# Patient Record
Sex: Female | Born: 1970 | Race: White | Hispanic: No | Marital: Married | Smoking: Never smoker
Health system: Southern US, Community
[De-identification: ages and names within clinical notes are randomized; demographics above are authoritative.]

## PROBLEM LIST (undated history)

## (undated) DIAGNOSIS — I341 Nonrheumatic mitral (valve) prolapse: Secondary | ICD-10-CM

## (undated) DIAGNOSIS — I4581 Long QT syndrome: Secondary | ICD-10-CM

## (undated) DIAGNOSIS — R1032 Left lower quadrant pain: Secondary | ICD-10-CM

## (undated) DIAGNOSIS — N83299 Other ovarian cyst, unspecified side: Secondary | ICD-10-CM

## (undated) DIAGNOSIS — I509 Heart failure, unspecified: Secondary | ICD-10-CM

## (undated) DIAGNOSIS — N3281 Overactive bladder: Secondary | ICD-10-CM

## (undated) DIAGNOSIS — R638 Other symptoms and signs concerning food and fluid intake: Secondary | ICD-10-CM

## (undated) DIAGNOSIS — D249 Benign neoplasm of unspecified breast: Secondary | ICD-10-CM

## (undated) DIAGNOSIS — Z8674 Personal history of sudden cardiac arrest: Secondary | ICD-10-CM

## (undated) HISTORY — DX: Nonrheumatic mitral (valve) prolapse: I34.1

## (undated) HISTORY — DX: Personal history of sudden cardiac arrest: Z86.74

## (undated) HISTORY — DX: Long QT syndrome: I45.81

## (undated) HISTORY — PX: BREAST BIOPSY: SHX20

## (undated) HISTORY — DX: Heart failure, unspecified: I50.9

## (undated) HISTORY — PX: BREAST EXCISIONAL BIOPSY: SUR124

## (undated) HISTORY — DX: Left lower quadrant pain: R10.32

## (undated) HISTORY — DX: Overactive bladder: N32.81

## (undated) HISTORY — DX: Other symptoms and signs concerning food and fluid intake: R63.8

## (undated) HISTORY — DX: Benign neoplasm of unspecified breast: D24.9

## (undated) HISTORY — DX: Other ovarian cyst, unspecified side: N83.299

## (undated) HISTORY — PX: MITRAL VALVE REPAIR: SHX2039

## (undated) MED FILL — Rosuvastatin Calcium Tab 5 MG: ORAL | Fill #0 | Status: CN

---

## 1999-02-18 HISTORY — PX: BREAST SURGERY: SHX581

## 2003-10-16 ENCOUNTER — Other Ambulatory Visit: Payer: Self-pay

## 2004-10-25 ENCOUNTER — Ambulatory Visit: Payer: Self-pay | Admitting: Internal Medicine

## 2005-01-16 ENCOUNTER — Emergency Department: Payer: Self-pay | Admitting: Emergency Medicine

## 2005-11-18 ENCOUNTER — Observation Stay: Payer: Self-pay

## 2005-11-24 ENCOUNTER — Inpatient Hospital Stay: Payer: Self-pay | Admitting: Obstetrics and Gynecology

## 2007-02-18 DIAGNOSIS — Z8674 Personal history of sudden cardiac arrest: Secondary | ICD-10-CM

## 2007-02-18 DIAGNOSIS — I4581 Long QT syndrome: Secondary | ICD-10-CM

## 2007-02-18 HISTORY — PX: CARDIAC DEFIBRILLATOR PLACEMENT: SHX171

## 2007-02-18 HISTORY — DX: Personal history of sudden cardiac arrest: Z86.74

## 2007-02-18 HISTORY — DX: Long QT syndrome: I45.81

## 2007-03-05 ENCOUNTER — Ambulatory Visit: Payer: Self-pay | Admitting: Obstetrics and Gynecology

## 2007-04-01 ENCOUNTER — Encounter: Payer: Self-pay | Admitting: Maternal & Fetal Medicine

## 2008-02-24 ENCOUNTER — Ambulatory Visit: Payer: Self-pay | Admitting: Obstetrics and Gynecology

## 2009-03-12 ENCOUNTER — Other Ambulatory Visit: Payer: Self-pay | Admitting: Family

## 2010-05-23 ENCOUNTER — Ambulatory Visit: Payer: Self-pay | Admitting: Internal Medicine

## 2011-01-07 ENCOUNTER — Encounter: Payer: Self-pay | Admitting: Internal Medicine

## 2011-02-07 ENCOUNTER — Ambulatory Visit (INDEPENDENT_AMBULATORY_CARE_PROVIDER_SITE_OTHER): Payer: BC Managed Care – PPO | Admitting: Internal Medicine

## 2011-02-07 ENCOUNTER — Encounter: Payer: Self-pay | Admitting: Internal Medicine

## 2011-02-07 VITALS — BP 102/68 | HR 64 | Temp 97.5°F | Ht 62.75 in | Wt 145.0 lb

## 2011-02-07 DIAGNOSIS — I341 Nonrheumatic mitral (valve) prolapse: Secondary | ICD-10-CM | POA: Insufficient documentation

## 2011-02-07 DIAGNOSIS — J329 Chronic sinusitis, unspecified: Secondary | ICD-10-CM

## 2011-02-07 DIAGNOSIS — I4581 Long QT syndrome: Secondary | ICD-10-CM | POA: Insufficient documentation

## 2011-02-07 MED ORDER — MOMETASONE FUROATE 50 MCG/ACT NA SUSP: 2.0000 | Freq: Every day | NASAL | Status: DC

## 2011-02-07 MED ORDER — AMOXICILLIN-POT CLAVULANATE 875-125 MG PO TABS: 1.0000 | ORAL_TABLET | Freq: Two times a day (BID) | ORAL | Status: AC

## 2011-02-07 MED ORDER — FLUCONAZOLE 150 MG PO TABS: 150.0000 mg | ORAL_TABLET | Freq: Every day | ORAL | Status: DC

## 2011-02-07 NOTE — Progress Notes (Signed)
  Subjective:    Patient ID: Alyssa Melendez, female    DOB: 27-Nov-1970, 40 y.o.   MRN: [id number]  HPI  Alyssa Melendez is a 40 yo [employer] who has a history of mitral valve prolapse, long QT syndrome, who presents for her annual exam. Her PAP smear is done regularly by her gynecologist.     Review of Systems     Objective:   Physical Exam        Assessment & Plan:   Subjective:     Alyssa Melendez is a 40 y.o. female and is here for a comprehensive physical exam. The patient reports problems - sinusitis.  History   Social History  . Marital Status: Single    Spouse Name: N/A    Number of Children: N/A  . Years of Education: N/A   Occupational History  . Not on file.   Social History Main Topics  . Smoking status: Never Smoker   . Smokeless tobacco: Not on file  . Alcohol Use: Yes  . Drug Use: No  . Sexually Active:    Other Topics Concern  . Not on file   Social History Narrative  . No narrative on file   No health maintenance topics applied.  The following portions of the patient's history were reviewed and updated as appropriate: allergies, current medications, past family history, past medical history, past social history, past surgical history and problem list.  Review of Systems A comprehensive review of systems was negative except for: Ears, nose, mouth, throat, and face: positive for nasal congestion, snoring and sore throat   Objective:    BP 102/68  Pulse 64  Temp(Src) 97.5 F (36.4 C) (Oral)  Ht 5' 2.75" (1.594 m)  Wt 145 lb (65.772 kg)  BMI 25.89 kg/m2  LMP 02/07/2011 General appearance: alert, cooperative and appears stated age Head: Normocephalic, without obvious abnormality, atraumatic Eyes: conjunctivae/corneas clear. PERRL, EOM's intact. Fundi benign. Ears: normal TM's and external ear canals both ears Nose: purulent discharge, moderate congestion, sinus tenderness bilateral Throat: lips, mucosa, and tongue normal; teeth and gums  normal Neck: mild anterior cervical adenopathy, no carotid bruit, no JVD, supple, symmetrical, trachea midline and thyroid not enlarged, symmetric, no tenderness/mass/nodules Lungs: clear to auscultation bilaterally Heart: regular rate and rhythm, S1, S2 normal, no murmur, click, rub or gallop Abdomen: soft, non-tender; bowel sounds normal; no masses,  no organomegaly Extremities: extremities normal, atraumatic, no cyanosis or edema Skin: Skin color, texture, turgor normal. No rashes or lesions Lymph nodes: Axillary adenopathy: normal, Supraclavicular adenopathy: normal and Inguinal adenopathy: normal Neurologic: Alert and oriented X 3, normal strength and tone. Normal symmetric reflexes. Normal coordination and gait   Assessment:    Healthy female exam.  Breast and pelvic exam done annually by her gynecologist.  Sinusitis: Mitral valve prolapse,  Long QT syndrome  Scheduled for repair Jan 7    Plan:  Reviewed screening  labs by hospital, including lipids and fasting glucose.  No issues. Augmentin and saline nasal spry for sinusitis.  Topical Afrin prn, avoid decongestants given cardiac history. Repair of MVP per patient scheduled at [facility] for Jan 7   See After Visit Summary for Counseling Recommendations

## 2011-02-12 ENCOUNTER — Encounter: Payer: Self-pay | Admitting: Internal Medicine

## 2011-02-12 DIAGNOSIS — Z8674 Personal history of sudden cardiac arrest: Secondary | ICD-10-CM | POA: Insufficient documentation

## 2011-03-03 ENCOUNTER — Telehealth: Payer: Self-pay | Admitting: *Deleted

## 2011-03-03 DIAGNOSIS — D649 Anemia, unspecified: Secondary | ICD-10-CM

## 2011-03-03 NOTE — Telephone Encounter (Signed)
Patient informed order for CBC. She had surgery recently and now c/o slight dizziness upon standing. She would like Hgb & Hct checked, states they were low at d/c after surgery.

## 2011-03-03 NOTE — Telephone Encounter (Signed)
Cbc order placed in [facility]

## 2011-03-03 NOTE — Telephone Encounter (Signed)
Patient informed, Scheduled for labs tomorrow

## 2011-03-04 ENCOUNTER — Other Ambulatory Visit (INDEPENDENT_AMBULATORY_CARE_PROVIDER_SITE_OTHER): Payer: BC Managed Care – PPO | Admitting: *Deleted

## 2011-03-04 DIAGNOSIS — D649 Anemia, unspecified: Secondary | ICD-10-CM

## 2011-03-04 LAB — CBC WITH DIFFERENTIAL/PLATELET
Basophils Absolute: 0 10*3/uL (ref 0.0–0.1)
Eosinophils Absolute: 0.8 10*3/uL — ABNORMAL HIGH (ref 0.0–0.7)
Hemoglobin: 11 g/dL — ABNORMAL LOW (ref 12.0–15.0)
Lymphocytes Relative: 11.6 % — ABNORMAL LOW (ref 12.0–46.0)
Lymphs Abs: 1.2 10*3/uL (ref 0.7–4.0)
Monocytes Relative: 10.2 % (ref 3.0–12.0)
Neutro Abs: 7.4 10*3/uL (ref 1.4–7.7)
Neutrophils Relative %: 70.3 % (ref 43.0–77.0)
RBC: 3.48 Mil/uL — ABNORMAL LOW (ref 3.87–5.11)
RDW: 14.1 % (ref 11.5–14.6)
WBC: 10.5 10*3/uL (ref 4.5–10.5)

## 2011-04-04 ENCOUNTER — Other Ambulatory Visit: Payer: Self-pay | Admitting: Internal Medicine

## 2011-04-04 MED ORDER — AMOXICILLIN-POT CLAVULANATE 875-125 MG PO TABS: 1.0000 | ORAL_TABLET | Freq: Two times a day (BID) | ORAL | Status: AC

## 2011-04-08 ENCOUNTER — Other Ambulatory Visit: Payer: Self-pay | Admitting: *Deleted

## 2011-04-09 MED ORDER — FLUCONAZOLE 150 MG PO TABS: 150.0000 mg | ORAL_TABLET | Freq: Every day | ORAL | Status: AC

## 2011-04-10 ENCOUNTER — Telehealth: Payer: Self-pay | Admitting: Internal Medicine

## 2011-04-10 NOTE — Telephone Encounter (Signed)
Got a message from her pharamacy about a contraindication between flucomazole rx and amiodarone.  If somenone has prescribed her amiodarone (not me)  She should not take fluconazole. Please let her know

## 2011-04-10 NOTE — Telephone Encounter (Signed)
Left message on voice mail asking pt to call me back.

## 2011-04-17 LAB — HM MAMMOGRAPHY

## 2011-04-23 ENCOUNTER — Other Ambulatory Visit: Payer: Self-pay | Admitting: Internal Medicine

## 2011-04-23 MED ORDER — FLUCONAZOLE 100 MG PO TABS: 100.0000 mg | ORAL_TABLET | Freq: Every day | ORAL | Status: DC

## 2011-07-19 HISTORY — PX: CARDIAC DEFIBRILLATOR PLACEMENT: SHX171

## 2011-09-26 ENCOUNTER — Ambulatory Visit (INDEPENDENT_AMBULATORY_CARE_PROVIDER_SITE_OTHER): Payer: BC Managed Care – PPO | Admitting: Internal Medicine

## 2011-09-26 ENCOUNTER — Encounter: Payer: Self-pay | Admitting: Internal Medicine

## 2011-09-26 VITALS — BP 100/60 | HR 106 | Temp 98.4°F | Resp 16 | Wt 148.5 lb

## 2011-09-26 DIAGNOSIS — L259 Unspecified contact dermatitis, unspecified cause: Secondary | ICD-10-CM

## 2011-09-26 DIAGNOSIS — H539 Unspecified visual disturbance: Secondary | ICD-10-CM

## 2011-09-26 DIAGNOSIS — M25512 Pain in left shoulder: Secondary | ICD-10-CM

## 2011-09-26 DIAGNOSIS — I341 Nonrheumatic mitral (valve) prolapse: Secondary | ICD-10-CM

## 2011-09-26 DIAGNOSIS — M25519 Pain in unspecified shoulder: Secondary | ICD-10-CM

## 2011-09-26 DIAGNOSIS — I059 Rheumatic mitral valve disease, unspecified: Secondary | ICD-10-CM

## 2011-09-26 MED ORDER — ALPRAZOLAM 0.25 MG PO TABS: 0.2500 mg | ORAL_TABLET | Freq: Every evening | ORAL | Status: DC | PRN

## 2011-09-26 MED ORDER — TRIAMCINOLONE ACETONIDE 0.1 % EX LOTN: TOPICAL_LOTION | Freq: Two times a day (BID) | CUTANEOUS | Status: DC

## 2011-09-28 ENCOUNTER — Encounter: Payer: Self-pay | Admitting: Internal Medicine

## 2011-09-28 DIAGNOSIS — L259 Unspecified contact dermatitis, unspecified cause: Secondary | ICD-10-CM | POA: Insufficient documentation

## 2011-09-28 DIAGNOSIS — M25512 Pain in left shoulder: Secondary | ICD-10-CM | POA: Insufficient documentation

## 2011-09-28 DIAGNOSIS — H539 Unspecified visual disturbance: Secondary | ICD-10-CM | POA: Insufficient documentation

## 2011-09-28 NOTE — Assessment & Plan Note (Signed)
Involving the preauricular area and hairline behind her left ear. refill steroid cream and use as needed.

## 2011-09-28 NOTE — Assessment & Plan Note (Addendum)
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 The onset of the changes occurred after she went to mitral valve repair which required being on bypass for several hours. This occurred in January 2013 .  She has not seen a neurologist or a neuro-ophthalmologist. I'm referring her to call sooner for evaluation of her optic nerve.

## 2011-09-28 NOTE — Assessment & Plan Note (Signed)
Her left shoulder pain may be due to do to rotator cuff syndrome. It could also be related to her ICD placement and mitral valve repair. Will refer to 10 bars for evaluation.

## 2011-09-28 NOTE — Progress Notes (Signed)
Patient ID: Alyssa Melendez, female   DOB: 1970-05-26, 41 y.o.   MRN: [id number]  Patient Active Problem List  Diagnosis  . Long Q-T syndrome  . Mitral valve prolapse  . Hx-sudden cardiac arrest  . Visual changes  . Shoulder pain, left  . Contact dermatitis and eczema    Subjective:  CC:   Chief Complaint  Patient presents with  . Shoulder Pain    left  . Rash  . Eye Problem    vision problems    HPI:   Alyssa Melendez a 41 y.o. female who presents with several issues #1)  she's been having a recurrence of the rash behind her left ear which is now up into in the hairline. She thinks is caused by her sunglasses which she wears frequently and sweats behind behind the sunglasses arm. She is requesting a refill on steroid cream that I gave her last time. #2 ) left arm and shoulder pain she believes she has damaged her rotator cuff. She is having trouble reaching backwards and doing any internal rotation. She has no history of falls she carries  Her purse on the other side. Symptoms have been present for several months. #3)  since her mitral valve repair in January 2013 he has had several discrete episodes of visual changes were everything turns of silver. She describes the a background of everything as watery and sober. She still able to see and and has already seen her optometrist thinks it is a form of an optic migraine. The episodes happen once or twice a week they occur both at rest and at work. They have not become more frequent. She's had no prior ophthalmologist neurology evaluation.   Past Medical History  Diagnosis Date  . Long Q-T syndrome 2009    with Sudden Cardiac arrest  . Mitral valve prolapse   . Hx-sudden cardiac arrest 2009    secondary to Long QT Syndrome    Past Surgical History  Procedure Date  . Cardiac defibrillator placement 2009  . Cesarean section 2007 and 2005  . Breast surgery 2001    Benign  . Cardiac defibrillator placement June 2013          The following portions of the patient's history were reviewed and updated as appropriate: Allergies, current medications, and problem list.    Review of Systems:   12 Pt  review of systems was negative except those addressed in the HPI,     History   Social History  . Marital Status: Married    Spouse Name: N/A    Number of Children: N/A  . Years of Education: N/A   Occupational History  . Not on file.   Social History Main Topics  . Smoking status: Never Smoker   . Smokeless tobacco: Never Used  . Alcohol Use: Yes  . Drug Use: No  . Sexually Active: Not on file   Other Topics Concern  . Not on file   Social History Narrative  . No narrative on file    Objective:  BP 100/60  Pulse 106  Temp 98.4 F (36.9 C) (Oral)  Resp 16  Wt 148 lb 8 oz (67.359 kg)  SpO2 97%  LMP 09/14/2011  General appearance: alert, cooperative and appears stated age Ears: normal TM's and external ear canals both ears Throat: lips, mucosa, and tongue normal; teeth and gums normal Neck: no adenopathy, no carotid bruit, supple, symmetrical, trachea midline and thyroid not enlarged, symmetric, no tenderness/mass/nodules Back: symmetric, no  curvature. ROM normal. No CVA tenderness. Lungs: clear to auscultation bilaterally Heart: regular rate and rhythm, S1, S2 normal, no murmur, click, rub or gallop Abdomen: soft, non-tender; bowel sounds normal; no masses,  no organomegaly Pulses: 2+ and symmetric Skin: Skin color, texture, turgor normal. No rashes or lesions Lymph nodes: Cervical, supraclavicular, and axillary nodes normal.  Assessment and Plan:  Mitral valve prolapse Status post mitral valve repair in January 2013 at [facility].  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 The onset of the changes occurred after she went to mitral valve repair  which required being on bypass for several hours. This occurred in January 2013 .  She has not seen a neurologist or a neuro-ophthalmologist. I'm referring her to call sooner for evaluation of her optic nerve.  Shoulder pain, left Her left shoulder pain may be due to do to rotator cuff syndrome. It could also be related to her ICD placement and mitral valve repair. Will refer to 10 bars for evaluation.  Contact dermatitis and eczema Involving the preauricular area and hairline behind her left ear. refill steroid cream and use as needed.   Updated Medication List Outpatient Encounter Prescriptions as of 09/26/2011  Medication Sig Dispense Refill  . ALPRAZolam (XANAX) 0.25 MG tablet Take 1 tablet (0.25 mg total) by mouth at bedtime as needed.  30 tablet  1  . aspirin 81 MG tablet Take 81 mg by mouth daily.      Marland Kitchen atenolol (TENORMIN) 50 MG tablet Take 25 mg by mouth every morning.       . mometasone (NASONEX) 50 MCG/ACT nasal spray Place 2 sprays into the nose daily.  17 g  12  . Multiple Vitamin (MULTIVITAMIN) tablet Take 1 tablet by mouth daily.        Marland Kitchen triamcinolone lotion (KENALOG) 0.1 % Apply topically 2 (two) times daily.  60 mL  1  . DISCONTD: ALPRAZolam (XANAX) 0.25 MG tablet Take 0.25 mg by mouth at bedtime as needed.        Marland Kitchen DISCONTD: triamcinolone lotion (KENALOG) 0.1 %       . DISCONTD: atenolol (TENORMIN) 50 MG tablet 50 mg. One and half tab in the morning         Orders Placed This Encounter  Procedures  . Ambulatory referral to Ophthalmology  . Ambulatory referral to Sports Medicine    No Follow-up on file.

## 2011-09-28 NOTE — Assessment & Plan Note (Signed)
Status post mitral valve repair in January 2013 at [facility].

## 2012-02-13 ENCOUNTER — Encounter: Payer: BC Managed Care – PPO | Admitting: Internal Medicine

## 2012-02-16 ENCOUNTER — Encounter: Payer: Self-pay | Admitting: Internal Medicine

## 2012-02-16 ENCOUNTER — Ambulatory Visit (INDEPENDENT_AMBULATORY_CARE_PROVIDER_SITE_OTHER): Payer: BC Managed Care – PPO | Admitting: Internal Medicine

## 2012-02-16 VITALS — BP 100/70 | HR 68 | Temp 97.8°F | Resp 12 | Ht 62.75 in | Wt 154.0 lb

## 2012-02-16 DIAGNOSIS — R5383 Other fatigue: Secondary | ICD-10-CM

## 2012-02-16 DIAGNOSIS — R5381 Other malaise: Secondary | ICD-10-CM

## 2012-02-16 DIAGNOSIS — I4581 Long QT syndrome: Secondary | ICD-10-CM

## 2012-02-16 DIAGNOSIS — L680 Hirsutism: Secondary | ICD-10-CM

## 2012-02-16 DIAGNOSIS — J329 Chronic sinusitis, unspecified: Secondary | ICD-10-CM

## 2012-02-16 MED ORDER — FLUCONAZOLE 150 MG PO TABS: 150.0000 mg | ORAL_TABLET | Freq: Every day | ORAL | Status: DC

## 2012-02-16 MED ORDER — MOMETASONE FUROATE 50 MCG/ACT NA SUSP: 2.0000 | Freq: Every day | NASAL | Status: DC

## 2012-02-16 NOTE — Assessment & Plan Note (Signed)
With hitory of prior VT arrest s/p defibrillator.  No recent events, cardiology follow up is up to date.

## 2012-02-16 NOTE — Progress Notes (Signed)
Patient ID: Alyssa Melendez, female   DOB: 01-27-1971, 41 y.o.   MRN: [id number] Subjective:     Alyssa Melendez is a 41 y.o. female and is here for a comprehensive physical exam. The patient reports feeling well, except for fatigue and increased body hair. Periods have been regular and normal   History   Social History  . Marital Status: Married    Spouse Name: N/A    Number of Children: N/A  . Years of Education: N/A   Occupational History  . Not on file.   Social History Main Topics  . Smoking status: Never Smoker   . Smokeless tobacco: Never Used  . Alcohol Use: Yes     Comment: occassionally  . Drug Use: No  . Sexually Active: Not on file   Other Topics Concern  . Not on file   Social History Narrative  . No narrative on file   Health Maintenance  Topic Date Due  . Pap Smear  08/12/1990  . Tetanus/tdap  08/12/1991  . Influenza Vaccine  10/19/2011    The following portions of the patient's history were reviewed and updated as appropriate: allergies, current medications, past family history, past medical history, past social history, past surgical history and problem list.  Review of Systems A comprehensive review of systems was negative.   Objective:    BP 100/70  Pulse 68  Temp 97.8 F (36.6 C) (Oral)  Resp 12  Ht 5' 2.75" (1.594 m)  Wt 154 lb (69.854 kg)  BMI 27.50 kg/m2  SpO2 100%  LMP 02/03/2012  General Appearance:    Alert, cooperative, no distress, appears stated age  Head:    Normocephalic, without obvious abnormality, atraumatic  Eyes:    PERRL, conjunctiva/corneas clear, EOM's intact, fundi    benign, both eyes  Ears:    Normal TM's and external ear canals, both ears  Nose:   Nares normal, septum midline, mucosa normal, no drainage    or sinus tenderness  Throat:   Lips, mucosa, and tongue normal; teeth and gums normal  Neck:   Supple, symmetrical, trachea midline, no adenopathy;    thyroid:  no enlargement/tenderness/nodules; no carotid  bruit or JVD  Back:     Symmetric, no curvature, ROM normal, no CVA tenderness  Lungs:     Clear to auscultation bilaterally, respirations unlabored  Chest Wall:    No tenderness or deformity     Extremities:   Extremities normal, atraumatic, no cyanosis or edema  Pulses:   2+ and symmetric all extremities  Skin:   Skin color, texture, turgor normal, no rashes or lesions  Lymph nodes:   Cervical, supraclavicular, and axillary nodes normal  Neurologic:   CNII-XII intact, normal strength, sensation and reflexes    throughout      Assessment and Plan  Long Q-T syndrome With hitory of prior VT arrest s/p defibrillator.  No recent events, cardiology follow up is up to date.   Fatigue screening for thyroid, anemia, etc.  Recommended exercise and weight loss.   Hirsutism Checking hormone levels.    Updated Medication List Outpatient Encounter Prescriptions as of 02/16/2012  Medication Sig Dispense Refill  . ALPRAZolam (XANAX) 0.25 MG tablet Take 1 tablet (0.25 mg total) by mouth at bedtime as needed.  30 tablet  1  . amoxicillin (AMOXIL) 500 MG capsule 500 mg. Take 4 by mouth prior to dental visit      . aspirin 81 MG tablet Take 81 mg by mouth daily.      Marland Kitchen  atenolol (TENORMIN) 25 MG tablet Take 25 mg by mouth daily.      . Multiple Vitamin (MULTIVITAMIN) tablet Take 1 tablet by mouth daily.        Marland Kitchen triamcinolone lotion (KENALOG) 0.1 % Apply topically 2 (two) times daily as needed.      . [DISCONTINUED] triamcinolone lotion (KENALOG) 0.1 % Apply topically 2 (two) times daily.  60 mL  1  . fluconazole (DIFLUCAN) 150 MG tablet Take 1 tablet (150 mg total) by mouth daily.  2 tablet  0  . mometasone (NASONEX) 50 MCG/ACT nasal spray Place 2 sprays into the nose daily.  17 g  12  . [DISCONTINUED] atenolol (TENORMIN) 50 MG tablet Take 25 mg by mouth every morning.       . [DISCONTINUED] mometasone (NASONEX) 50 MCG/ACT nasal spray Place 2 sprays into the nose daily.  17 g  12

## 2012-02-16 NOTE — Patient Instructions (Addendum)
I will send you the results of your hormone screening via MyChart

## 2012-02-16 NOTE — Assessment & Plan Note (Signed)
screening for thyroid, anemia, etc.  Recommended exercise and weight loss.

## 2012-02-16 NOTE — Assessment & Plan Note (Signed)
Checking hormone levels.

## 2012-02-17 LAB — TESTOSTERONE, FREE, TOTAL, SHBG
Sex Hormone Binding: 62 nmol/L (ref 18–114)
Testosterone, Free: 2.4 pg/mL (ref 0.6–6.8)
Testosterone: 20.03 ng/dL (ref 10–70)

## 2012-02-17 LAB — FOLLICLE STIMULATING HORMONE: FSH: 7.4 m[IU]/mL

## 2012-02-17 LAB — TSH: TSH: 1.08 u[IU]/mL (ref 0.35–5.50)

## 2012-07-19 LAB — HM PAP SMEAR: HM PAP: NEGATIVE

## 2012-08-24 ENCOUNTER — Ambulatory Visit: Payer: Self-pay | Admitting: Obstetrics and Gynecology

## 2012-11-22 DIAGNOSIS — I469 Cardiac arrest, cause unspecified: Secondary | ICD-10-CM | POA: Insufficient documentation

## 2012-11-22 HISTORY — DX: Cardiac arrest, cause unspecified: I46.9

## 2013-02-21 ENCOUNTER — Ambulatory Visit (INDEPENDENT_AMBULATORY_CARE_PROVIDER_SITE_OTHER): Payer: BC Managed Care – PPO | Admitting: Internal Medicine

## 2013-02-21 ENCOUNTER — Encounter: Payer: Self-pay | Admitting: Internal Medicine

## 2013-02-21 VITALS — BP 98/64 | HR 70 | Temp 98.1°F | Ht 62.75 in | Wt 154.5 lb

## 2013-02-21 DIAGNOSIS — S43422S Sprain of left rotator cuff capsule, sequela: Secondary | ICD-10-CM

## 2013-02-21 DIAGNOSIS — N760 Acute vaginitis: Secondary | ICD-10-CM

## 2013-02-21 DIAGNOSIS — Z0000 Encounter for general adult medical examination without abnormal findings: Secondary | ICD-10-CM

## 2013-02-21 DIAGNOSIS — M25519 Pain in unspecified shoulder: Secondary | ICD-10-CM

## 2013-02-21 DIAGNOSIS — M722 Plantar fascial fibromatosis: Secondary | ICD-10-CM | POA: Insufficient documentation

## 2013-02-21 DIAGNOSIS — M25512 Pain in left shoulder: Secondary | ICD-10-CM

## 2013-02-21 DIAGNOSIS — IMO0002 Reserved for concepts with insufficient information to code with codable children: Secondary | ICD-10-CM

## 2013-02-21 DIAGNOSIS — R5383 Other fatigue: Secondary | ICD-10-CM

## 2013-02-21 DIAGNOSIS — R5381 Other malaise: Secondary | ICD-10-CM

## 2013-02-21 DIAGNOSIS — E785 Hyperlipidemia, unspecified: Secondary | ICD-10-CM

## 2013-02-21 DIAGNOSIS — S43429A Sprain of unspecified rotator cuff capsule, initial encounter: Secondary | ICD-10-CM | POA: Insufficient documentation

## 2013-02-21 DIAGNOSIS — Z1239 Encounter for other screening for malignant neoplasm of breast: Secondary | ICD-10-CM

## 2013-02-21 DIAGNOSIS — I4581 Long QT syndrome: Secondary | ICD-10-CM

## 2013-02-21 DIAGNOSIS — L680 Hirsutism: Secondary | ICD-10-CM

## 2013-02-21 DIAGNOSIS — J329 Chronic sinusitis, unspecified: Secondary | ICD-10-CM

## 2013-02-21 DIAGNOSIS — G4700 Insomnia, unspecified: Secondary | ICD-10-CM

## 2013-02-21 LAB — CBC WITH DIFFERENTIAL/PLATELET
Basophils Absolute: 0 10*3/uL (ref 0.0–0.1)
Basophils Relative: 0.9 % (ref 0.0–3.0)
EOS PCT: 2.3 % (ref 0.0–5.0)
Eosinophils Absolute: 0.1 10*3/uL (ref 0.0–0.7)
HCT: 38.8 % (ref 36.0–46.0)
Hemoglobin: 13.2 g/dL (ref 12.0–15.0)
LYMPHS ABS: 1.3 10*3/uL (ref 0.7–4.0)
Lymphocytes Relative: 29.1 % (ref 12.0–46.0)
MCHC: 33.9 g/dL (ref 30.0–36.0)
MCV: 92.2 fl (ref 78.0–100.0)
Monocytes Absolute: 0.4 10*3/uL (ref 0.1–1.0)
Monocytes Relative: 9.6 % (ref 3.0–12.0)
Neutro Abs: 2.6 10*3/uL (ref 1.4–7.7)
Neutrophils Relative %: 58.1 % (ref 43.0–77.0)
Platelets: 237 10*3/uL (ref 150.0–400.0)
RBC: 4.21 Mil/uL (ref 3.87–5.11)
RDW: 13.3 % (ref 11.5–14.6)
WBC: 4.4 10*3/uL — ABNORMAL LOW (ref 4.5–10.5)

## 2013-02-21 LAB — COMPREHENSIVE METABOLIC PANEL
ALT: 14 U/L (ref 0–35)
AST: 19 U/L (ref 0–37)
Albumin: 4.5 g/dL (ref 3.5–5.2)
Alkaline Phosphatase: 36 U/L — ABNORMAL LOW (ref 39–117)
BILIRUBIN TOTAL: 0.6 mg/dL (ref 0.3–1.2)
BUN: 12 mg/dL (ref 6–23)
CO2: 23 mEq/L (ref 19–32)
Calcium: 9 mg/dL (ref 8.4–10.5)
Chloride: 108 mEq/L (ref 96–112)
Creatinine, Ser: 0.9 mg/dL (ref 0.4–1.2)
GFR: 71.67 mL/min (ref >60.00)
GLUCOSE: 88 mg/dL (ref 70–99)
Potassium: 4.4 mEq/L (ref 3.5–5.1)
Sodium: 140 mEq/L (ref 135–145)
Total Protein: 7 g/dL (ref 6.0–8.3)

## 2013-02-21 LAB — LIPID PANEL
CHOLESTEROL: 210 mg/dL — AB (ref 0–200)
HDL: 53.8 mg/dL (ref >39.00)
Total CHOL/HDL Ratio: 4
Triglycerides: 57 mg/dL (ref 0.0–149.0)
VLDL: 11.4 mg/dL (ref 0.0–40.0)

## 2013-02-21 LAB — LDL CHOLESTEROL, DIRECT: Direct LDL: 147.3 mg/dL

## 2013-02-21 LAB — TSH: TSH: 0.83 u[IU]/mL (ref 0.35–5.50)

## 2013-02-21 MED ORDER — IBUPROFEN 800 MG PO TABS: 800.0000 mg | ORAL_TABLET | Freq: Three times a day (TID) | ORAL | Status: DC | PRN

## 2013-02-21 MED ORDER — FLUCONAZOLE 150 MG PO TABS: 150.0000 mg | ORAL_TABLET | Freq: Every day | ORAL | Status: DC

## 2013-02-21 MED ORDER — MOMETASONE FUROATE 50 MCG/ACT NA SUSP: 2.0000 | Freq: Every day | NASAL | Status: DC

## 2013-02-21 MED ORDER — ALPRAZOLAM 0.25 MG PO TABS: 0.2500 mg | ORAL_TABLET | Freq: Every evening | ORAL | Status: DC | PRN

## 2013-02-21 NOTE — Progress Notes (Signed)
Patient ID: Alyssa Melendez, female   DOB: May 06, 1970, 43 y.o.   MRN: [id number]   Subjective:     Alyssa Melendez is a 43 y.o. female and is here for a comprehensive physical exam. The patient reports no problems. Mammogram done at [facility] and PAP done by Dr D so is up to date.   Defibrillator placed by Dr. Jeralyn Bennett,  [facility] [place] with 6 month follow ups and weekly downloads. No firings.   Menstrual periods have been occurring regularly no heavy bleeding, Non HRT    History   Social History  . Marital Status: Married    Spouse Name: N/A    Number of Children: N/A  . Years of Education: N/A   Occupational History  . Not on file.   Social History Main Topics  . Smoking status: Never Smoker   . Smokeless tobacco: Never Used  . Alcohol Use: Yes     Comment: occassionally  . Drug Use: No  . Sexual Activity: Not on file   Other Topics Concern  . Not on file   Social History Narrative  . No narrative on file   Health Maintenance  Topic Date Due  . Tetanus/tdap  08/12/1991  . Influenza Vaccine  09/17/2013  . Pap Smear  11/17/2014    The following portions of the patient's history were reviewed and updated as appropriate: allergies, current medications, past family history, past medical history, past social history, past surgical history and problem list.  Review of Systems A comprehensive review of systems was negative.   Objective:   BP 98/64  Pulse 70  Temp(Src) 98.1 F (36.7 C) (Oral)  Ht 5' 2.75" (1.594 m)  Wt 154 lb 8 oz (70.081 kg)  BMI 27.58 kg/m2  SpO2 98%  LMP 02/14/2013  General appearance: alert, cooperative and appears stated age Ears: normal TM's and external ear canals both ears Throat: lips, mucosa, and tongue normal; teeth and gums normal Neck: no adenopathy, no carotid bruit, supple, symmetrical, trachea midline and thyroid not enlarged, symmetric, no tenderness/mass/nodules Back: symmetric, no curvature. ROM normal. No CVA  tenderness. Lungs: clear to auscultation bilaterally Heart: regular rate and rhythm, S1, S2 normal, no murmur, click, rub or gallop Abdomen: soft, non-tender; bowel sounds normal; no masses,  no organomegaly Pulses: 2+ and symmetric Skin: Skin color, texture, turgor normal. No rashes or lesions Lymph nodes: Cervical, supraclavicular, and axillary nodes normal.   Assessment and Plan:   Routine general medical examination at a health care facility Annual comprehensive exam was done excluding breast, pelvic and PAP smear. All screenings have been addressed .   Shoulder pain, left Improved with PT>  Rotator cuff strain   Long Q-T syndrome S/p cardiac arrest resulting in defib placement by Dr [facility] in [place]. contineu follow up with him   Hirsutism Normal testosterone levels.  Reassurance provided. Lab Results  Component Value Date   TESTOSTERONE 20.03 02/16/2012    Fatigue Stress related,  All screenings normal, no signs of OSA    Updated Medication List Outpatient Encounter Prescriptions as of 02/21/2013  Medication Sig  . ALPRAZolam (XANAX) 0.25 MG tablet Take 1 tablet (0.25 mg total) by mouth at bedtime as needed.  Marland Kitchen amoxicillin (AMOXIL) 500 MG capsule 500 mg. Take 4 by mouth prior to dental visit  . aspirin 81 MG tablet Take 81 mg by mouth daily.  Marland Kitchen atenolol (TENORMIN) 25 MG tablet Take 25 mg by mouth daily.  . Multiple Vitamin (MULTIVITAMIN) tablet  Take 1 tablet by mouth daily.    . [DISCONTINUED] ALPRAZolam (XANAX) 0.25 MG tablet Take 1 tablet (0.25 mg total) by mouth at bedtime as needed.  . fluconazole (DIFLUCAN) 150 MG tablet Take 1 tablet (150 mg total) by mouth daily.  Marland Kitchen ibuprofen (ADVIL,MOTRIN) 800 MG tablet Take 1 tablet (800 mg total) by mouth every 8 (eight) hours as needed.  . mometasone (NASONEX) 50 MCG/ACT nasal spray Place 2 sprays into the nose daily.  Marland Kitchen triamcinolone lotion (KENALOG) 0.1 % Apply topically 2 (two) times daily as needed.   . [DISCONTINUED] fluconazole (DIFLUCAN) 150 MG tablet Take 1 tablet (150 mg total) by mouth daily.  . [DISCONTINUED] mometasone (NASONEX) 50 MCG/ACT nasal spray Place 2 sprays into the nose daily.

## 2013-02-21 NOTE — Patient Instructions (Addendum)
Please take a probiotic ( Align, Flora jen or Culturelle or the generic equivalent ) while you are on the antibiotic to prevent a serious antibiotic associated diarrhea  Called clostirudium dificile colitis and a vaginal yeast infection   Fasting labs today   Will send them to you via Mychart if you sign up

## 2013-02-21 NOTE — Progress Notes (Signed)
Prescriptions faxed to pharmacy as instructed since they were printed.

## 2013-02-21 NOTE — Progress Notes (Signed)
Pre-visit discussion using our clinic review tool. No additional management support is needed unless otherwise documented below in the visit note.  

## 2013-02-22 ENCOUNTER — Encounter: Payer: Self-pay | Admitting: Internal Medicine

## 2013-02-22 DIAGNOSIS — Z0000 Encounter for general adult medical examination without abnormal findings: Secondary | ICD-10-CM | POA: Insufficient documentation

## 2013-02-22 DIAGNOSIS — Z0001 Encounter for general adult medical examination with abnormal findings: Secondary | ICD-10-CM | POA: Insufficient documentation

## 2013-02-22 NOTE — Assessment & Plan Note (Signed)
Annual comprehensive exam was done excluding breast, pelvic and PAP smear. All screenings have been addressed .

## 2013-02-22 NOTE — Assessment & Plan Note (Signed)
Normal testosterone levels.  Reassurance provided. Lab Results  Component Value Date   TESTOSTERONE 20.03 02/16/2012

## 2013-02-22 NOTE — Assessment & Plan Note (Signed)
S/p cardiac arrest resulting in defib placement by Dr [facility] in [place]. contineu follow up with him

## 2013-02-22 NOTE — Assessment & Plan Note (Signed)
Improved with PT>  Rotator cuff strain

## 2013-02-22 NOTE — Assessment & Plan Note (Signed)
Stress related,  All screenings normal, no signs of OSA

## 2013-09-19 ENCOUNTER — Ambulatory Visit: Payer: Self-pay | Admitting: Internal Medicine

## 2013-09-19 LAB — HM MAMMOGRAPHY

## 2013-09-20 ENCOUNTER — Encounter: Payer: Self-pay | Admitting: *Deleted

## 2013-09-21 ENCOUNTER — Telehealth: Payer: Self-pay | Admitting: Internal Medicine

## 2013-09-21 DIAGNOSIS — R921 Mammographic calcification found on diagnostic imaging of breast: Secondary | ICD-10-CM | POA: Insufficient documentation

## 2013-10-14 ENCOUNTER — Ambulatory Visit: Payer: Self-pay | Admitting: Internal Medicine

## 2013-11-02 ENCOUNTER — Encounter: Payer: Self-pay | Admitting: Family Medicine

## 2013-11-02 ENCOUNTER — Ambulatory Visit (INDEPENDENT_AMBULATORY_CARE_PROVIDER_SITE_OTHER): Payer: BC Managed Care – PPO | Admitting: Family Medicine

## 2013-11-02 VITALS — BP 112/64 | HR 72 | Temp 98.3°F | Ht 62.75 in | Wt 153.8 lb

## 2013-11-02 DIAGNOSIS — G5602 Carpal tunnel syndrome, left upper limb: Secondary | ICD-10-CM

## 2013-11-02 DIAGNOSIS — G5600 Carpal tunnel syndrome, unspecified upper limb: Secondary | ICD-10-CM | POA: Insufficient documentation

## 2013-11-02 NOTE — Patient Instructions (Signed)
Get a wrist splint for the left hand for carpal tunnel -wear it whenever you can  (also while sleeping) Use cold compress on wrist as often as possible for about 10 minutes  You can take aleve 1 pill with food up to twice daily - to decrease inflammation ( take it with food) If no improvement in 10-14 days -call for a specialist referral

## 2013-11-02 NOTE — Progress Notes (Signed)
Pre visit review using our clinic review tool, if applicable. No additional management support is needed unless otherwise documented below in the visit note. 

## 2013-11-02 NOTE — Assessment & Plan Note (Signed)
With pos tinel and phalen test  Disc relative rest  consv tx with wrist splint/cold compress/aleve with caution  Update if not starting to improve in a week or if worsening  - would consider specialist ref

## 2013-11-02 NOTE — Progress Notes (Signed)
Subjective:    Patient ID: Alyssa Melendez, female    DOB: 17-Oct-1970, 43 y.o.   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 Breast calcifications on mammogram 09/21/2013  . Routine general medical examination at a health care facility 02/22/2013  . Rotator cuff (capsule) sprain 02/21/2013  . Plantar fasciitis, bilateral 02/21/2013  . Hirsutism 02/16/2012  . Fatigue 02/16/2012  . Shoulder pain, left 09/28/2011  . Hx-sudden cardiac arrest   . Long Q-T syndrome   . Mitral valve prolapse    Past Medical History  Diagnosis Date  . Long Q-T syndrome 2009    with Sudden Cardiac arrest  . Mitral valve prolapse   . Hx-sudden cardiac arrest 2009    secondary to Long QT Syndrome   Past Surgical History  Procedure Laterality Date  . Cardiac defibrillator placement  2009  . Cesarean section  2007 and 2005  . Breast surgery  2001    Benign  . Cardiac defibrillator placement  June 2013   History  Substance Use Topics  . Smoking status: Never Smoker   . Smokeless tobacco: Never Used  . Alcohol Use: Yes     Comment: occassionally   Family History  Problem Relation Age of Onset  . Hyperlipidemia Mother   . Cancer Mother 21    melanoma  . Arthritis Mother     bilateral total knee replacements  . Heart disease Father     CABG x 3   . Mental illness Father     Alzheimers Dementia  . Hyperlipidemia Father   . Stroke Father     ich   Allergies  Allergen Reactions  . Morphine And Related     GI - vomitting   Current Outpatient Prescriptions on File Prior to Visit  Medication Sig Dispense Refill  . ALPRAZolam (XANAX) 0.25 MG  tablet Take 1 tablet (0.25 mg total) by mouth at bedtime as needed.  30 tablet  1  . amoxicillin (AMOXIL) 500 MG capsule 500 mg. Take 4 by mouth prior to dental visit      . aspirin 81 MG tablet Take 81 mg by mouth daily.      Marland Kitchen atenolol (TENORMIN) 25 MG tablet Take 25 mg by mouth daily.      . fluconazole (DIFLUCAN) 150 MG tablet Take 1 tablet (150 mg total) by mouth daily.  2 tablet  0  . ibuprofen (ADVIL,MOTRIN) 800 MG tablet Take 1 tablet (800 mg total) by mouth every 8 (eight) hours as needed.  30 tablet  1  . mometasone (NASONEX) 50 MCG/ACT nasal spray Place 2 sprays into the nose daily.  17 g  12  . Multiple Vitamin (MULTIVITAMIN) tablet Take 1 tablet by mouth daily.        Marland Kitchen triamcinolone lotion (KENALOG) 0.1 % Apply topically 2 (two) times daily as needed.       No current facility-administered medications on file prior to visit.      Review of Systems Review of Systems  Constitutional: Negative for fever, appetite change, fatigue and  unexpected weight change.  Eyes: Negative for pain and visual disturbance.  Respiratory: Negative for cough and shortness of breath.   Cardiovascular: Negative for cp or palpitations    Gastrointestinal: Negative for nausea, diarrhea and constipation.  Genitourinary: Negative for urgency and frequency.  Skin: Negative for pallor or rash   Neurological: Negative for weakness, light-headedness, and headaches.  Hematological: Negative for adenopathy. Does not bruise/bleed easily.  Psychiatric/Behavioral: Negative for dysphoric mood. The patient is not nervous/anxious.         Objective:   Physical Exam  Constitutional: She appears well-developed and well-nourished. No distress.  overwt and well app  HENT:  Head: Normocephalic and atraumatic.  Eyes: Conjunctivae and EOM are normal. Pupils are equal, round, and reactive to light.  Neck: Normal range of motion. Neck supple.  Cardiovascular: Normal rate, regular rhythm and intact distal pulses.     Musculoskeletal: She exhibits no edema and no tenderness.  No acute joint changes in either hand or wrist   Lymphadenopathy:    She has no cervical adenopathy.  Neurological: She is alert. She has normal reflexes. She displays no atrophy. No cranial nerve deficit or sensory deficit. She exhibits normal muscle tone. Coordination normal.  Pos Tinel and Phalen tests on L hand -causing tingling in the first 3 fingers and part of pal  Skin: Skin is warm and dry. No rash noted. No erythema. No pallor.  Psychiatric: She has a normal mood and affect.          Assessment & Plan:   Problem List Items Addressed This Visit     Nervous and Auditory   Carpal tunnel syndrome - Primary     With pos tinel and phalen test  Disc relative rest  consv tx with wrist splint/cold compress/aleve with caution  Update if not starting to improve in a week or if worsening  - would consider specialist ref

## 2013-12-19 ENCOUNTER — Encounter: Payer: Self-pay | Admitting: Internal Medicine

## 2013-12-28 ENCOUNTER — Encounter: Payer: Self-pay | Admitting: Internal Medicine

## 2014-02-21 ENCOUNTER — Telehealth: Payer: Self-pay | Admitting: Internal Medicine

## 2014-02-21 DIAGNOSIS — M79674 Pain in right toe(s): Secondary | ICD-10-CM

## 2014-02-21 DIAGNOSIS — Z0000 Encounter for general adult medical examination without abnormal findings: Secondary | ICD-10-CM

## 2014-02-21 NOTE — Telephone Encounter (Signed)
She's a little young for gout , but if the toe is hot, red and painful to touch , AND she has no fever or recent trauma to toe I will call in an antiinflammatory for her to take.  i added CRP and ESR to labs, because during an acute gout flare the uric acid level can be misleading by being normal

## 2014-02-21 NOTE — Telephone Encounter (Signed)
Left message for pt to return my call.

## 2014-02-21 NOTE — Telephone Encounter (Signed)
Asking Juliann Pulse to give her a call .  She asked to have labs done , but Dr. Derrel Nip was not her to put in orders.

## 2014-02-21 NOTE — Telephone Encounter (Signed)
Patient is concerned that she may be having a gout attack in right great toe had an appointment for 02/22/14 which has been changed patient would like to come in for fasting labs this week. And to have Uric acid level drawn, please advise i have put labs in please verify for any additional.

## 2014-02-22 ENCOUNTER — Encounter: Payer: BC Managed Care – PPO | Admitting: Internal Medicine

## 2014-02-22 NOTE — Telephone Encounter (Signed)
Left message for patient to call office.  

## 2014-02-22 NOTE — Telephone Encounter (Signed)
Lab appointment scheduled.

## 2014-02-23 ENCOUNTER — Other Ambulatory Visit (INDEPENDENT_AMBULATORY_CARE_PROVIDER_SITE_OTHER): Payer: BLUE CROSS/BLUE SHIELD

## 2014-02-23 DIAGNOSIS — M79674 Pain in right toe(s): Secondary | ICD-10-CM

## 2014-02-23 LAB — CBC WITH DIFFERENTIAL/PLATELET
Basophils Absolute: 0.1 10*3/uL (ref 0.0–0.1)
Basophils Relative: 1 % (ref 0.0–3.0)
Eosinophils Absolute: 0.6 10*3/uL (ref 0.0–0.7)
Eosinophils Relative: 8.3 % — ABNORMAL HIGH (ref 0.0–5.0)
HEMATOCRIT: 40.4 % (ref 36.0–46.0)
Hemoglobin: 13.4 g/dL (ref 12.0–15.0)
Lymphocytes Relative: 20.7 % (ref 12.0–46.0)
Lymphs Abs: 1.4 10*3/uL (ref 0.7–4.0)
MCHC: 33 g/dL (ref 30.0–36.0)
MCV: 92 fl (ref 78.0–100.0)
MONO ABS: 0.6 10*3/uL (ref 0.1–1.0)
MONOS PCT: 8.9 % (ref 3.0–12.0)
Neutro Abs: 4.3 10*3/uL (ref 1.4–7.7)
Neutrophils Relative %: 61.1 % (ref 43.0–77.0)
PLATELETS: 257 10*3/uL (ref 150.0–400.0)
RBC: 4.4 Mil/uL (ref 3.87–5.11)
RDW: 14 % (ref 11.5–15.5)
WBC: 7 10*3/uL (ref 4.0–10.5)

## 2014-02-23 LAB — COMPREHENSIVE METABOLIC PANEL
ALT: 15 U/L (ref 0–35)
AST: 20 U/L (ref 0–37)
Albumin: 4.4 g/dL (ref 3.5–5.2)
Alkaline Phosphatase: 52 U/L (ref 39–117)
BUN: 11 mg/dL (ref 6–23)
CO2: 26 meq/L (ref 19–32)
CREATININE: 0.8 mg/dL (ref 0.4–1.2)
Calcium: 9.3 mg/dL (ref 8.4–10.5)
Chloride: 107 mEq/L (ref 96–112)
GFR: 80.31 mL/min (ref >60.00)
Glucose, Bld: 81 mg/dL (ref 70–99)
Potassium: 4.2 mEq/L (ref 3.5–5.1)
Sodium: 141 mEq/L (ref 135–145)
Total Bilirubin: 0.6 mg/dL (ref 0.2–1.2)
Total Protein: 6.8 g/dL (ref 6.0–8.3)

## 2014-02-23 LAB — LIPID PANEL
CHOLESTEROL: 240 mg/dL — AB (ref 0–200)
HDL: 52.8 mg/dL (ref >39.00)
LDL CALC: 173 mg/dL — AB (ref 0–99)
NonHDL: 187.2
Total CHOL/HDL Ratio: 5
Triglycerides: 72 mg/dL (ref 0.0–149.0)
VLDL: 14.4 mg/dL (ref 0.0–40.0)

## 2014-02-23 LAB — SEDIMENTATION RATE: Sed Rate: 7 mm/hr (ref 0–22)

## 2014-02-23 LAB — TSH: TSH: 1.84 u[IU]/mL (ref 0.35–4.50)

## 2014-02-23 LAB — C-REACTIVE PROTEIN: CRP: <0.5 mg/dL (ref 0.5–20.0)

## 2014-02-23 LAB — URIC ACID: Uric Acid, Serum: 4.2 mg/dL (ref 2.4–7.0)

## 2014-02-23 LAB — VITAMIN D 25 HYDROXY (VIT D DEFICIENCY, FRACTURES): VITD: 16.29 ng/mL — ABNORMAL LOW (ref 30.00–100.00)

## 2014-03-30 ENCOUNTER — Encounter: Payer: Self-pay | Admitting: Internal Medicine

## 2014-04-11 ENCOUNTER — Encounter: Payer: Self-pay | Admitting: Internal Medicine

## 2014-04-11 ENCOUNTER — Ambulatory Visit (INDEPENDENT_AMBULATORY_CARE_PROVIDER_SITE_OTHER): Payer: BLUE CROSS/BLUE SHIELD | Admitting: Internal Medicine

## 2014-04-11 VITALS — BP 100/64 | HR 70 | Temp 97.4°F | Resp 16 | Ht 63.5 in | Wt 156.2 lb

## 2014-04-11 DIAGNOSIS — E785 Hyperlipidemia, unspecified: Secondary | ICD-10-CM | POA: Insufficient documentation

## 2014-04-11 DIAGNOSIS — R928 Other abnormal and inconclusive findings on diagnostic imaging of breast: Secondary | ICD-10-CM

## 2014-04-11 DIAGNOSIS — R921 Mammographic calcification found on diagnostic imaging of breast: Secondary | ICD-10-CM

## 2014-04-11 DIAGNOSIS — Z0000 Encounter for general adult medical examination without abnormal findings: Secondary | ICD-10-CM

## 2014-04-11 DIAGNOSIS — E663 Overweight: Secondary | ICD-10-CM | POA: Insufficient documentation

## 2014-04-11 MED ORDER — RED YEAST RICE 600 MG PO CAPS: 1.0000 | ORAL_CAPSULE | Freq: Two times a day (BID) | ORAL | Status: DC

## 2014-04-11 NOTE — Progress Notes (Signed)
Patient ID: Alyssa Melendez, female   DOB: 25-Jan-1971, 44 y.o.   MRN: [id number]  Subjective:     Alyssa Melendez is a 44 y.o. female and is here for a comprehensive physical exam. The patient reports weight gain after initially losing weight on a low carbohydrate diet .  History   Social History  . Marital Status: Married    Spouse Name: N/A  . Number of Children: N/A  . Years of Education: N/A   Occupational History  . Not on file.   Social History Main Topics  . Smoking status: Never Smoker   . Smokeless tobacco: Never Used  . Alcohol Use: Yes     Comment: occassionally  . Drug Use: No  . Sexual Activity: Not on file   Other Topics Concern  . Not on file   Social History Narrative   Health Maintenance  Topic Date Due  . Samul Dada  08/12/1991  . INFLUENZA VACCINE  09/17/2013  . PAP SMEAR  11/17/2014    The following portions of the patient's history were reviewed and updated as appropriate: allergies, current medications, past family history, past medical history, past social history, past surgical history and problem list.  Review of Systems  Patient denies headache, fevers, malaise, unintentional weight loss, skin rash, eye pain, sinus congestion and sinus pain, sore throat, dysphagia,  hemoptysis , cough, dyspnea, wheezing, chest pain, palpitations, orthopnea, edema, abdominal pain, nausea, melena, diarrhea, constipation, flank pain, dysuria, hematuria, urinary  Frequency, nocturia, numbness, tingling, seizures,  Focal weakness, Loss of consciousness,  Tremor, insomnia, depression, anxiety, and suicidal ideation.     Objective:  BP 100/64 mmHg  Pulse 70  Temp(Src) 97.4 F (36.3 C) (Oral)  Resp 16  Ht 5' 3.5" (1.613 m)  Wt 156 lb 4 oz (70.875 kg)  BMI 27.24 kg/m2  SpO2 100%  LMP 04/01/2014 (Exact Date)   General appearance: alert, cooperative and appears stated age Head: Normocephalic, without obvious abnormality, atraumatic Eyes: conjunctivae/corneas  clear. PERRL, EOM's intact. Fundi benign. Ears: normal TM's and external ear canals both ears Nose: Nares normal. Septum midline. Mucosa normal. No drainage or sinus tenderness. Throat: lips, mucosa, and tongue normal; teeth and gums normal Neck: no adenopathy, no carotid bruit, no JVD, supple, symmetrical, trachea midline and thyroid not enlarged, symmetric, no tenderness/mass/nodules Lungs: clear to auscultation bilaterally Breasts: normal appearance, no masses or tenderness Heart: regular rate and rhythm, S1, S2 normal, no murmur, click, rub or gallop Abdomen: soft, non-tender; bowel sounds normal; no masses,  no organomegaly Extremities: extremities normal, atraumatic, no cyanosis or edema Pulses: 2+ and symmetric Skin: Skin color, texture, turgor normal. No rashes or lesions Neurologic: Alert and oriented X 3, normal strength and tone. Normal symmetric reflexes. Normal coordination and gait.    Assessment and Plan:   Problem List Items Addressed This Visit    Routine general medical examination at a health care facility    Annual wellness  exam was done as well as a comprehensive physical exam and management of acute and chronic conditions .  During the course of the visit the patient was educated and counseled about appropriate screening and preventive services including :  diabetes screening, lipid analysis with projected  10 year  risk for CAD , nutrition counseling, colorectal cancer screening, and recommended immunizations.  Printed recommendations for health maintenance screenings was given.   Lab Results  Component Value Date   CHOL 240* 02/23/2014   HDL 52.80 02/23/2014   LDLCALC 173* 02/23/2014   LDLDIRECT  147.3 02/21/2013   TRIG 72.0 02/23/2014   CHOLHDL 5 02/23/2014   Lab Results  Component Value Date   TSH 1.84 02/23/2014   Lab Results  Component Value Date   CREATININE 0.8 02/23/2014   Lab Results  Component Value Date   NA 141 02/23/2014   K 4.2 02/23/2014    CL 107 02/23/2014   CO2 26 02/23/2014   Lab Results  Component Value Date   ALT 15 02/23/2014   AST 20 02/23/2014   ALKPHOS 52 02/23/2014   BILITOT 0.6 02/23/2014         Overweight    I have addressed  BMI and recommended wt loss of 10% of body weigh over the next 6 months using a low glycemic index diet and regular exercise a minimum of 5 days per week.        Hyperlipidemia    New ACC guidelines recommend starting patients aged 58 or higher on moderate intensity statin therapy for LDL between 70-189 and 10 yr risk of CAD > 7.5% ;  and high intensity therapy for anyone with LDL > 190.  Based on current lipid profile, the risk of clinically significant CAD is 1% over the next 10 years, using the Framingham risk calculator. Her only addition CRF is a  FH of early  CAD.   She is reluctant to start a statin at this time since she wants to continue losing weight.  Suggested a trial of RYR 600 mg bid and repeat in 6 months  Lab Results  Component Value Date   CHOL 240* 02/23/2014   HDL 52.80 02/23/2014   LDLCALC 173* 02/23/2014   LDLDIRECT 147.3 02/21/2013   TRIG 72.0 02/23/2014   CHOLHDL 5 02/23/2014         Breast calcifications on mammogram - Primary    Repeat mammogram was normal

## 2014-04-11 NOTE — Assessment & Plan Note (Addendum)
Annual wellness  exam was done as well as a comprehensive physical exam and management of acute and chronic conditions .  During the course of the visit the patient was educated and counseled about appropriate screening and preventive services including :  diabetes screening, lipid analysis with projected  10 year  risk for CAD , nutrition counseling, colorectal cancer screening, and recommended immunizations.  Printed recommendations for health maintenance screenings was given.   Lab Results  Component Value Date   CHOL 240* 02/23/2014   HDL 52.80 02/23/2014   LDLCALC 173* 02/23/2014   LDLDIRECT 147.3 02/21/2013   TRIG 72.0 02/23/2014   CHOLHDL 5 02/23/2014   Lab Results  Component Value Date   TSH 1.84 02/23/2014   Lab Results  Component Value Date   CREATININE 0.8 02/23/2014   Lab Results  Component Value Date   NA 141 02/23/2014   K 4.2 02/23/2014   CL 107 02/23/2014   CO2 26 02/23/2014   Lab Results  Component Value Date   ALT 15 02/23/2014   AST 20 02/23/2014   ALKPHOS 52 02/23/2014   BILITOT 0.6 02/23/2014

## 2014-04-11 NOTE — Progress Notes (Signed)
Pre-visit discussion using our clinic review tool. No additional management support is needed unless otherwise documented below in the visit note.  

## 2014-04-11 NOTE — Assessment & Plan Note (Signed)
I have addressed  BMI and recommended wt loss of 10% of body weigh over the next 6 months using a low glycemic index diet and regular exercise a minimum of 5 days per week.   

## 2014-04-11 NOTE — Patient Instructions (Signed)
I recommend getting the half of your calcium and Vitamin D  Requirements through dietary sources rather than supplements given the recent association of calcium supplements with increased coronary artery calcium scores (You need 1800 mg daily )    Goal wt 140 lbs for BM < 25  Red yeast rice twice daily 600 mg for cholesterol  And recheck in 6 months   unsweetened almond/coconut milk, Cashew and soy  Milks are all great low calorie low carb, cholesterol free sources of dietary calcium and vitamin D.    Health Maintenance Adopting a healthy lifestyle and getting preventive care can go a long way to promote health and wellness. Talk with your health care provider about what schedule of regular examinations is right for you. This is a good chance for you to check in with your provider about disease prevention and staying healthy. In between checkups, there are plenty of things you can do on your own. Experts have done a lot of research about which lifestyle changes and preventive measures are most likely to keep you healthy. Ask your health care provider for more information. WEIGHT AND DIET  Eat a healthy diet  Be sure to include plenty of vegetables, fruits, low-fat dairy products, and lean protein.  Do not eat a lot of foods high in solid fats, added sugars, or salt.  Get regular exercise. This is one of the most important things you can do for your health.  Most adults should exercise for at least 150 minutes each week. The exercise should increase your heart rate and make you sweat (moderate-intensity exercise).  Most adults should also do strengthening exercises at least twice a week. This is in addition to the moderate-intensity exercise.  Maintain a healthy weight  Body mass index (BMI) is a measurement that can be used to identify possible weight problems. It estimates body fat based on height and weight. Your health care provider can help determine your BMI and help you achieve or  maintain a healthy weight.  For females 15 years of age and older:   A BMI below 18.5 is considered underweight.  A BMI of 18.5 to 24.9 is normal.  A BMI of 25 to 29.9 is considered overweight.  A BMI of 30 and above is considered obese.  Watch levels of cholesterol and blood lipids  You should start having your blood tested for lipids and cholesterol at 42 years of age, then have this test every 5 years.  You may need to have your cholesterol levels checked more often if:  Your lipid or cholesterol levels are high.  You are older than 42 years of age.  You are at high risk for heart disease.  CANCER SCREENING   Lung Cancer  Lung cancer screening is recommended for adults 42-33 years old who are at high risk for lung cancer because of a history of smoking.  A yearly low-dose CT scan of the lungs is recommended for people who:  Currently smoke.  Have quit within the past 15 years.  Have at least a 30-pack-year history of smoking. A pack year is smoking an average of one pack of cigarettes a day for 1 year.  Yearly screening should continue until it has been 15 years since you quit.  Yearly screening should stop if you develop a health problem that would prevent you from having lung cancer treatment.  Breast Cancer  Practice breast self-awareness. This means understanding how your breasts normally appear and feel.  It also  means doing regular breast self-exams. Let your health care provider know about any changes, no matter how small.  If you are in your 42s or 30s, you should have a clinical breast exam (CBE) by a health care provider every 1-3 years as part of a regular health exam.  If you are 42 or older, have a CBE every year. Also consider having a breast X-ray (mammogram) every year.  If you have a family history of breast cancer, talk to your health care provider about genetic screening.  If you are at high risk for breast cancer, talk to your health care  provider about having an MRI and a mammogram every year.  Breast cancer gene (BRCA) assessment is recommended for women who have family members with BRCA-related cancers. BRCA-related cancers include:  Breast.  Ovarian.  Tubal.  Peritoneal cancers.  Results of the assessment will determine the need for genetic counseling and BRCA1 and BRCA2 testing. Cervical Cancer Routine pelvic examinations to screen for cervical cancer are no longer recommended for nonpregnant women who are considered low risk for cancer of the pelvic organs (ovaries, uterus, and vagina) and who do not have symptoms. A pelvic examination may be necessary if you have symptoms including those associated with pelvic infections. Ask your health care provider if a screening pelvic exam is right for you.   The Pap test is the screening test for cervical cancer for women who are considered at risk.  If you had a hysterectomy for a problem that was not cancer or a condition that could lead to cancer, then you no longer need Pap tests.  If you are older than 42 years, and you have had normal Pap tests for the past 10 years, you no longer need to have Pap tests.  If you have had past treatment for cervical cancer or a condition that could lead to cancer, you need Pap tests and screening for cancer for at least 20 years after your treatment.  If you no longer get a Pap test, assess your risk factors if they change (such as having a new sexual partner). This can affect whether you should start being screened again.  Some women have medical problems that increase their chance of getting cervical cancer. If this is the case for you, your health care provider may recommend more frequent screening and Pap tests.  The human papillomavirus (HPV) test is another test that may be used for cervical cancer screening. The HPV test looks for the virus that can cause cell changes in the cervix. The cells collected during the Pap test can be  tested for HPV.  The HPV test can be used to screen women 42 years of age and older. Getting tested for HPV can extend the interval between normal Pap tests from three to five years.  An HPV test also should be used to screen women of any age who have unclear Pap test results.  After 42 years of age, women should have HPV testing as often as Pap tests.  Colorectal Cancer  This type of cancer can be detected and often prevented.  Routine colorectal cancer screening usually begins at 42 years of age and continues through 42 years of age.  Your health care provider may recommend screening at an earlier age if you have risk factors for colon cancer.  Your health care provider may also recommend using home test kits to check for hidden blood in the stool.  A small camera at the end of  a tube can be used to examine your colon directly (sigmoidoscopy or colonoscopy). This is done to check for the earliest forms of colorectal cancer.  Routine screening usually begins at age 49.  Direct examination of the colon should be repeated every 5-10 years through 42 years of age. However, you may need to be screened more often if early forms of precancerous polyps or small growths are found. Skin Cancer  Check your skin from head to toe regularly.  Tell your health care provider about any new moles or changes in moles, especially if there is a change in a mole's shape or color.  Also tell your health care provider if you have a mole that is larger than the size of a pencil eraser.  Always use sunscreen. Apply sunscreen liberally and repeatedly throughout the day.  Protect yourself by wearing long sleeves, pants, a wide-brimmed hat, and sunglasses whenever you are outside. HEART DISEASE, DIABETES, AND HIGH BLOOD PRESSURE   Have your blood pressure checked at least every 1-2 years. High blood pressure causes heart disease and increases the risk of stroke.  If you are between 37 years and 13 years  old, ask your health care provider if you should take aspirin to prevent strokes.  Have regular diabetes screenings. This involves taking a blood sample to check your fasting blood sugar level.  If you are at a normal weight and have a low risk for diabetes, have this test once every three years after 42 years of age.  If you are overweight and have a high risk for diabetes, consider being tested at a younger age or more often. PREVENTING INFECTION  Hepatitis B  If you have a higher risk for hepatitis B, you should be screened for this virus. You are considered at high risk for hepatitis B if:  You were born in a country where hepatitis B is common. Ask your health care provider which countries are considered high risk.  Your parents were born in a high-risk country, and you have not been immunized against hepatitis B (hepatitis B vaccine).  You have HIV or AIDS.  You use needles to inject street drugs.  You live with someone who has hepatitis B.  You have had sex with someone who has hepatitis B.  You get hemodialysis treatment.  You take certain medicines for conditions, including cancer, organ transplantation, and autoimmune conditions. Hepatitis C  Blood testing is recommended for:  Everyone born from [birth year] through 1963.  Anyone with known risk factors for hepatitis C. Sexually transmitted infections (STIs)  You should be screened for sexually transmitted infections (STIs) including gonorrhea and chlamydia if:  You are sexually active and are younger than 42 years of age.  You are older than 42 years of age and your health care provider tells you that you are at risk for this type of infection.  Your sexual activity has changed since you were last screened and you are at an increased risk for chlamydia or gonorrhea. Ask your health care provider if you are at risk.  If you do not have HIV, but are at risk, it may be recommended that you take a prescription medicine  daily to prevent HIV infection. This is called pre-exposure prophylaxis (PrEP). You are considered at risk if:  You are sexually active and do not regularly use condoms or know the HIV status of your partner(s).  You take drugs by injection.  You are sexually active with a partner who has HIV. Talk  with your health care provider about whether you are at high risk of being infected with HIV. If you choose to begin PrEP, you should first be tested for HIV. You should then be tested every 3 months for as long as you are taking PrEP.  PREGNANCY   If you are premenopausal and you may become pregnant, ask your health care provider about preconception counseling.  If you may become pregnant, take 400 to 800 micrograms (mcg) of folic acid every day.  If you want to prevent pregnancy, talk to your health care provider about birth control (contraception). OSTEOPOROSIS AND MENOPAUSE   Osteoporosis is a disease in which the bones lose minerals and strength with aging. This can result in serious bone fractures. Your risk for osteoporosis can be identified using a bone density scan.  If you are 43 years of age or older, or if you are at risk for osteoporosis and fractures, ask your health care provider if you should be screened.  Ask your health care provider whether you should take a calcium or vitamin D supplement to lower your risk for osteoporosis.  Menopause may have certain physical symptoms and risks.  Hormone replacement therapy may reduce some of these symptoms and risks. Talk to your health care provider about whether hormone replacement therapy is right for you.  HOME CARE INSTRUCTIONS   Schedule regular health, dental, and eye exams.  Stay current with your immunizations.   Do not use any tobacco products including cigarettes, chewing tobacco, or electronic cigarettes.  If you are pregnant, do not drink alcohol.  If you are breastfeeding, limit how much and how often you drink  alcohol.  Limit alcohol intake to no more than 1 drink per day for nonpregnant women. One drink equals 12 ounces of beer, 5 ounces of wine, or 1 ounces of hard liquor.  Do not use street drugs.  Do not share needles.  Ask your health care provider for help if you need support or information about quitting drugs.  Tell your health care provider if you often feel depressed.  Tell your health care provider if you have ever been abused or do not feel safe at home. Document Released: 08/19/2010 Document Revised: 06/20/2013 Document Reviewed: 01/05/2013 [facility] Patient Information 2015 [place], [place]. This information is not intended to replace advice given to you by your health care provider. Make sure you discuss any questions you have with your health care provider.

## 2014-04-11 NOTE — Assessment & Plan Note (Signed)
Repeat mammogram was normal

## 2014-04-11 NOTE — Assessment & Plan Note (Addendum)
New ACC guidelines recommend starting patients aged 42 or higher on moderate intensity statin therapy for LDL between 70-189 and 10 yr risk of CAD > 7.5% ;  and high intensity therapy for anyone with LDL > 190.  Based on current lipid profile, the risk of clinically significant CAD is 1% over the next 10 years, using the Framingham risk calculator. Her only addition CRF is a  FH of early  CAD.   She is reluctant to start a statin at this time since she wants to continue losing weight.  Suggested a trial of RYR 600 mg bid and repeat in 6 months  Lab Results  Component Value Date   CHOL 240* 02/23/2014   HDL 52.80 02/23/2014   LDLCALC 173* 02/23/2014   LDLDIRECT 147.3 02/21/2013   TRIG 72.0 02/23/2014   CHOLHDL 5 02/23/2014

## 2014-08-02 ENCOUNTER — Other Ambulatory Visit: Payer: Self-pay

## 2014-08-02 DIAGNOSIS — N83202 Unspecified ovarian cyst, left side: Secondary | ICD-10-CM

## 2014-08-04 ENCOUNTER — Ambulatory Visit: Payer: BLUE CROSS/BLUE SHIELD

## 2014-08-04 DIAGNOSIS — N83202 Unspecified ovarian cyst, left side: Secondary | ICD-10-CM

## 2014-08-04 DIAGNOSIS — N8320 Unspecified ovarian cysts: Secondary | ICD-10-CM | POA: Diagnosis not present

## 2014-08-08 ENCOUNTER — Other Ambulatory Visit: Payer: Self-pay

## 2014-08-16 ENCOUNTER — Other Ambulatory Visit: Payer: Self-pay

## 2014-10-19 ENCOUNTER — Ambulatory Visit: Payer: BLUE CROSS/BLUE SHIELD | Admitting: Internal Medicine

## 2014-11-22 ENCOUNTER — Ambulatory Visit: Payer: BLUE CROSS/BLUE SHIELD | Admitting: Internal Medicine

## 2014-11-29 ENCOUNTER — Ambulatory Visit (INDEPENDENT_AMBULATORY_CARE_PROVIDER_SITE_OTHER): Payer: BLUE CROSS/BLUE SHIELD | Admitting: Internal Medicine

## 2014-11-29 ENCOUNTER — Encounter: Payer: Self-pay | Admitting: Internal Medicine

## 2014-11-29 VITALS — BP 112/78 | HR 78 | Temp 97.7°F | Resp 18 | Ht 63.5 in | Wt 158.0 lb

## 2014-11-29 DIAGNOSIS — E785 Hyperlipidemia, unspecified: Secondary | ICD-10-CM | POA: Diagnosis not present

## 2014-11-29 DIAGNOSIS — E559 Vitamin D deficiency, unspecified: Secondary | ICD-10-CM | POA: Diagnosis not present

## 2014-11-29 DIAGNOSIS — I4581 Long QT syndrome: Secondary | ICD-10-CM

## 2014-11-29 DIAGNOSIS — E663 Overweight: Secondary | ICD-10-CM

## 2014-11-29 LAB — COMPREHENSIVE METABOLIC PANEL
ALK PHOS: 45 U/L (ref 39–117)
ALT: 11 U/L (ref 0–35)
AST: 15 U/L (ref 0–37)
Albumin: 4.4 g/dL (ref 3.5–5.2)
BUN: 10 mg/dL (ref 6–23)
CO2: 29 mEq/L (ref 19–32)
Calcium: 9.3 mg/dL (ref 8.4–10.5)
Chloride: 105 mEq/L (ref 96–112)
Creatinine, Ser: 0.79 mg/dL (ref 0.40–1.20)
GFR: 84.71 mL/min (ref >60.00)
Glucose, Bld: 90 mg/dL (ref 70–99)
Potassium: 4.5 mEq/L (ref 3.5–5.1)
Sodium: 139 mEq/L (ref 135–145)
TOTAL PROTEIN: 6.8 g/dL (ref 6.0–8.3)
Total Bilirubin: 0.5 mg/dL (ref 0.2–1.2)

## 2014-11-29 LAB — LIPID PANEL
Cholesterol: 219 mg/dL — ABNORMAL HIGH (ref 0–200)
HDL: 49.1 mg/dL (ref >39.00)
LDL Cholesterol: 150 mg/dL — ABNORMAL HIGH (ref 0–99)
NONHDL: 169.83
Total CHOL/HDL Ratio: 4
Triglycerides: 100 mg/dL (ref 0.0–149.0)
VLDL: 20 mg/dL (ref 0.0–40.0)

## 2014-11-29 LAB — VITAMIN D 25 HYDROXY (VIT D DEFICIENCY, FRACTURES): VITD: 55.25 ng/mL (ref 30.00–100.00)

## 2014-11-29 NOTE — Progress Notes (Signed)
Pre visit review using our clinic review tool, if applicable. No additional management support is needed unless otherwise documented below in the visit note. 

## 2014-11-29 NOTE — Patient Instructions (Addendum)
The  diet I discussed with you today is the 10 day Green Smoothie Cleansing /Detox Diet by Linden Dolin . available on [employer] for around $10.  This is not a low carb or a weight loss diet,  It is fundamentally a "cleansing" low fat diet that eliminates sugar, gluten, caffeine, alcohol and dairy for 10 days .  What you add back after the initial ten days is entirely up to  you!  You can expect to lose 5 to 10 lbs depending on how strict you are.   I found that  drinking 2 smoothies or juices  daily and keeping one chewable meal (but keep it simple, like baked fish and salad, rice or bok choy) kept me satisfied and kept me from straying  .  You snack primarily on fresh  fruit, egg whites and judicious quantities of nuts. I  Recommend adding a  vegetable based protein powder  To any smoothie made with almond milk.  (nothing with whey , since whey is dairy) in it.  [employer] has a [employer], political party .   It does require some form of a nutrient extractor (Vita Mix, a electric juicer,  Or a Nutribullet Rx).  i have found that using frozen fruits is much more convenient and cost effective. You can even find plenty of organic fruit in the frozen fruit section of [employer].  Just thaw what you need for the following day the night before in the refrigerator (to avoid jamming up your machine)    The organic greens drink I use if I don't have any fresh greens  is called "Suja" and it's sold in the vegetable refrigerated section of most grocery stores (including [employer])  . It is tart, though, so be careful (has lemon juice in it )  The organic vegan protein powder I tried  is called Vega" and I found it at [employer] .  It is sugar free

## 2014-11-29 NOTE — Progress Notes (Signed)
Subjective:  Patient ID: Alyssa Melendez, female    DOB: 1970-04-11  Age: 44 y.o. MRN: [id number]  CC: The primary encounter diagnosis was Vitamin D deficiency. Diagnoses of Hyperlipidemia, Long Q-T syndrome, and Overweight were also pertinent to this visit.  HPI [name] presents for follow up on hyperlipidemia, long QT syndrome s/p AICD for sudden cardiac arrest, and overweight.   She is taking her medications as directing but has reduced her  RYR to once daily due to stomach upset with the  higher dose.  Tyryng to lose weight with dietary changes and exercise.     Has semi annual cardiology follow up at [facility],  Getting retested for inherited syndromes  this November   Outpatient Prescriptions Prior to Visit  Medication Sig Dispense Refill  . ALPRAZolam (XANAX) 0.25 MG tablet Take 1 tablet (0.25 mg total) by mouth at bedtime as needed. 30 tablet 1  . amoxicillin (AMOXIL) 500 MG capsule 500 mg. Take 4 by mouth prior to dental visit    . aspirin 81 MG tablet Take 81 mg by mouth daily.    Marland Kitchen atenolol (TENORMIN) 25 MG tablet Take 25 mg by mouth daily.    . Cholecalciferol 2000 UNITS CAPS Take 1 capsule by mouth daily.    . fluconazole (DIFLUCAN) 150 MG tablet Take 1 tablet (150 mg total) by mouth daily. 2 tablet 0  . ibuprofen (ADVIL,MOTRIN) 800 MG tablet Take 1 tablet (800 mg total) by mouth every 8 (eight) hours as needed. 30 tablet 1  . Multiple Vitamin (MULTIVITAMIN) tablet Take 1 tablet by mouth daily.      . Red Yeast Rice 600 MG CAPS Take 1 capsule (600 mg total) by mouth 2 (two) times daily. 60 capsule 3  . triamcinolone lotion (KENALOG) 0.1 % Apply topically 2 (two) times daily as needed.    . mometasone (NASONEX) 50 MCG/ACT nasal spray Place 2 sprays into the nose daily. 17 g 12   No facility-administered medications prior to visit.    Review of Systems;  Patient denies headache, fevers, malaise, unintentional weight loss, skin rash, eye pain, sinus congestion and sinus  pain, sore throat, dysphagia,  hemoptysis , cough, dyspnea, wheezing, chest pain, palpitations, orthopnea, edema, abdominal pain, nausea, melena, diarrhea, constipation, flank pain, dysuria, hematuria, urinary  Frequency, nocturia, numbness, tingling, seizures,  Focal weakness, Loss of consciousness,  Tremor, insomnia, depression, anxiety, and suicidal ideation.      Objective:  BP 112/78 mmHg  Pulse 78  Temp(Src) 97.7 F (36.5 C) (Oral)  Resp 18  Ht 5' 3.5" (1.613 m)  Wt 158 lb (71.668 kg)  BMI 27.55 kg/m2  SpO2 98%  BP Readings from Last 3 Encounters:  11/29/14 112/78  04/11/14 100/64  11/02/13 112/64    Wt Readings from Last 3 Encounters:  11/29/14 158 lb (71.668 kg)  04/11/14 156 lb 4 oz (70.875 kg)  11/02/13 153 lb 12 oz (69.741 kg)    General appearance: alert, cooperative and appears stated age Ears: normal TM's and external ear canals both ears Throat: lips, mucosa, and tongue normal; teeth and gums normal Neck: no adenopathy, no carotid bruit, supple, symmetrical, trachea midline and thyroid not enlarged, symmetric, no tenderness/mass/nodules Back: symmetric, no curvature. ROM normal. No CVA tenderness. Lungs: clear to auscultation bilaterally Heart: regular rate and rhythm, S1, S2 normal, no murmur, click, rub or gallop Abdomen: soft, non-tender; bowel sounds normal; no masses,  no organomegaly Pulses: 2+ and symmetric Skin: Skin color, texture, turgor normal. No rashes  or lesions Lymph nodes: Cervical, supraclavicular, and axillary nodes normal.  No results found for: HGBA1C  Lab Results  Component Value Date   CREATININE 0.79 11/29/2014   CREATININE 0.8 02/23/2014   CREATININE 0.9 02/21/2013    Lab Results  Component Value Date   WBC 7.0 02/23/2014   HGB 13.4 02/23/2014   HCT 40.4 02/23/2014   PLT 257.0 02/23/2014   GLUCOSE 90 11/29/2014   CHOL 219* 11/29/2014   TRIG 100.0 11/29/2014   HDL 49.10 11/29/2014   LDLDIRECT 147.3 02/21/2013   LDLCALC  150* 11/29/2014   ALT 11 11/29/2014   AST 15 11/29/2014   NA 139 11/29/2014   K 4.5 11/29/2014   CL 105 11/29/2014   CREATININE 0.79 11/29/2014   BUN 10 11/29/2014   CO2 29 11/29/2014   TSH 1.84 02/23/2014    No results found.  Assessment & Plan:   Problem List Items Addressed This Visit    Long Q-T syndrome    S/p cardiac arrest resulting in defib placement by Dr [facility] in [place]. continue follow up with him         Relevant Orders   Comprehensive metabolic panel (Completed)   Hyperlipidemia    LDL and triglycerides are at goal on current medications.10% risk of CAD is 3% . She has no side effects and liver enzymes are normal. continue red yeast rice.  Lab Results  Component Value Date   CHOL 219* 11/29/2014   HDL 49.10 11/29/2014   LDLCALC 150* 11/29/2014   LDLDIRECT 147.3 02/21/2013   TRIG 100.0 11/29/2014   CHOLHDL 4 11/29/2014        Relevant Orders   Lipid panel (Completed)   Overweight    I have addressed  BMI and recommended a low glycemic index diet utilizing smaller more frequent meals to increase metabolism.  I have also recommended that patient start exercising with a goal of 30 minutes of aerobic exercise a minimum of 5 days per week. Screening for lipid disorders, thyroid and diabetes to be done today.        Vitamin D deficiency - Primary    Resolved by repeat testing,  Continue 2,000 IUS D3 daily       Relevant Orders   Vit D  25 hydroxy (rtn osteoporosis monitoring) (Completed)      A total of 25 minutes of face to face time was spent with patient more than half of which was spent in counselling about the above mentioned conditions  and coordination of care  I am having Ms. Carlota Raspberry maintain her multivitamin, aspirin, atenolol, triamcinolone lotion, amoxicillin, ibuprofen, mometasone, ALPRAZolam, fluconazole, Cholecalciferol, and Red Yeast Rice.  No orders of the defined types were placed in this encounter.    There  are no discontinued medications.  Follow-up: No Follow-up on file.   Crecencio Mc, MD

## 2014-12-01 ENCOUNTER — Encounter: Payer: Self-pay | Admitting: Internal Medicine

## 2014-12-01 NOTE — Assessment & Plan Note (Signed)
Resolved by repeat testing,  Continue 2,000 IUS D3 daily

## 2014-12-01 NOTE — Assessment & Plan Note (Signed)
LDL and triglycerides are at goal on current medications.10% risk of CAD is 3% . She has no side effects and liver enzymes are normal. continue red yeast rice.  Lab Results  Component Value Date   CHOL 219* 11/29/2014   HDL 49.10 11/29/2014   LDLCALC 150* 11/29/2014   LDLDIRECT 147.3 02/21/2013   TRIG 100.0 11/29/2014   CHOLHDL 4 11/29/2014

## 2014-12-01 NOTE — Assessment & Plan Note (Signed)
S/p cardiac arrest resulting in defib placement by Dr [facility] in [place]. continue follow up with him

## 2014-12-01 NOTE — Assessment & Plan Note (Signed)
I have addressed  BMI and recommended a low glycemic index diet utilizing smaller more frequent meals to increase metabolism.  I have also recommended that patient start exercising with a goal of 30 minutes of aerobic exercise a minimum of 5 days per week. Screening for lipid disorders, thyroid and diabetes to be done today.   

## 2015-01-31 ENCOUNTER — Encounter: Payer: Self-pay | Admitting: Physician Assistant

## 2015-01-31 ENCOUNTER — Ambulatory Visit: Payer: Self-pay | Admitting: Physician Assistant

## 2015-01-31 VITALS — BP 100/70 | HR 60 | Temp 97.9°F

## 2015-01-31 DIAGNOSIS — J069 Acute upper respiratory infection, unspecified: Secondary | ICD-10-CM

## 2015-01-31 MED ORDER — AMOXICILLIN 875 MG PO TABS: 875.0000 mg | ORAL_TABLET | Freq: Two times a day (BID) | ORAL | Status: DC

## 2015-01-31 NOTE — Progress Notes (Signed)
S: C/o runny nose and congestion for 3 days, no fever, chills, cp/sob, v/d; mucus is thick, cough is sporadic, c/o of facial and dental pain. Voice is hoarse  Using otc meds:   O: PE: perrl eomi, normocephalic, tms dull, nasal mucosa red and swollen, throat injected, neck supple no lymph, lungs c t a, cv rrr, neuro intact  A:  Acute sinusitis   P: amoxil 858m bid x 10d,  drink fluids, continue regular meds , use otc meds of choice, return if not improving in 5 days, return earlier if worsening

## 2015-04-13 ENCOUNTER — Encounter: Payer: BLUE CROSS/BLUE SHIELD | Admitting: Internal Medicine

## 2015-04-16 ENCOUNTER — Ambulatory Visit: Payer: BLUE CROSS/BLUE SHIELD | Admitting: Internal Medicine

## 2015-05-14 ENCOUNTER — Encounter: Payer: Self-pay | Admitting: Internal Medicine

## 2015-05-14 ENCOUNTER — Ambulatory Visit (INDEPENDENT_AMBULATORY_CARE_PROVIDER_SITE_OTHER): Payer: 59 | Admitting: Internal Medicine

## 2015-05-14 VITALS — BP 100/62 | HR 70 | Temp 97.5°F | Resp 12 | Ht 63.0 in | Wt 166.5 lb

## 2015-05-14 DIAGNOSIS — M25562 Pain in left knee: Secondary | ICD-10-CM | POA: Diagnosis not present

## 2015-05-14 DIAGNOSIS — E663 Overweight: Secondary | ICD-10-CM | POA: Diagnosis not present

## 2015-05-14 DIAGNOSIS — Z0001 Encounter for general adult medical examination with abnormal findings: Secondary | ICD-10-CM | POA: Diagnosis not present

## 2015-05-14 DIAGNOSIS — R921 Mammographic calcification found on diagnostic imaging of breast: Secondary | ICD-10-CM

## 2015-05-14 DIAGNOSIS — I4581 Long QT syndrome: Secondary | ICD-10-CM

## 2015-05-14 DIAGNOSIS — E785 Hyperlipidemia, unspecified: Secondary | ICD-10-CM

## 2015-05-14 DIAGNOSIS — R928 Other abnormal and inconclusive findings on diagnostic imaging of breast: Secondary | ICD-10-CM

## 2015-05-14 DIAGNOSIS — Z0000 Encounter for general adult medical examination without abnormal findings: Secondary | ICD-10-CM

## 2015-05-14 NOTE — Progress Notes (Signed)
Patient ID: Alyssa Melendez, female    DOB: 09/06/1970  Age: 45 y.o. MRN: [id number]  The patient is here for annual wellness examination and management of other chronic and acute problems.   The risk factors are reflected in the social history.  The roster of all physicians providing medical care to patient - is listed in the Snapshot section of the chart.  Activities of daily living:  The patient is 100% independent in all ADLs: dressing, toileting, feeding as well as independent mobility  Home safety : The patient has smoke detectors in the home. They wear seatbelts.  There are no firearms at home. There is no violence in the home.   There is no risks for hepatitis, STDs or HIV. There is no   history of blood transfusion. They have no travel history to infectious disease endemic areas of the world.  The patient has seen their dentist in the last six month. They have seen their eye doctor in the last year. They admit to slight hearing difficulty with regard to whispered voices and some television programs.  They have deferred audiologic testing in the last year.  They do not  have excessive sun exposure. Discussed the need for sun protection: hats, long sleeves and use of sunscreen if there is significant sun exposure.   Diet: the importance of a healthy diet is discussed. They do have a healthy diet.  The benefits of regular aerobic exercise were discussed. She walks 4 times per week ,  20 minutes.   Depression screen: there are no signs or vegative symptoms of depression- irritability, change in appetite, anhedonia, sadness/tearfullness.  Cognitive assessment: the patient manages all their financial and personal affairs and is actively engaged. They could relate day,date,year and events; recalled 2/3 objects at 3 minutes; performed clock-face test normally.  The following portions of the patient's history were reviewed and updated as appropriate: allergies, current medications, past family  history, past medical history,  past surgical history, past social history  and problem list.  Visual acuity was not assessed per patient preference since she has regular follow up with her ophthalmologist. Hearing and body mass index were assessed and reviewed.   During the course of the visit the patient was educated and counseled about appropriate screening and preventive services including : fall prevention , diabetes screening, nutrition counseling, colorectal cancer screening, and recommended immunizations.    CC: The primary encounter diagnosis was Posterior left knee pain. Diagnoses of Long Q-T syndrome, Overweight, Routine general medical examination at a health care facility, Breast calcifications on mammogram, and Hyperlipidemia were also pertinent to this visit.   ECHO DONE MARCH, AT [facility]  EP Dr Clyda Hurdle   EF NORMAL ,   PACER WIRES IN CORRECT PLACES.  NO MITRAL STENOSIS PELVIC US NORMAL JUNE 2016 DE [facility] NORMAL OCT 2016  Had genetic testing ordered by EP Dr Nancy Fetter Negative for Long QT.  Still told to avoid meds that prolong QT  EP changed atenolol to  Corguard 40  .  Feels fatigued from it.  thinsk her regular EP will reduce dose.     Weight gain of 8 lbs since october.  Hurt left knee at the gym in January .Occurred the day after starting a routine on a stationery bike and hurt knee ,  Ice ibuprofen and essential oils.  Pain occurs posteriolrly center of knee  Feels  Sore and tight,  No bruising no popping, No immediate pain , but the day after was  very painful.  Knee feels like it would hyperextend if she allowed it    Discussed referral to [facility] since she can't have an MRI  due to defibrillator.   Discussed diet     History Karesa has a past medical history of Long Q-T syndrome (2009); Mitral valve prolapse; and sudden cardiac arrest (2009).   She has past surgical history that includes Cardiac defibrillator placement (2009); Cesarean section (2007 and  2005); Breast surgery (2001); and Cardiac defibrillator placement (June 2013).   Her family history includes Arthritis in her mother; Cancer (age of onset: 80) in her mother; Heart disease in her father; Hyperlipidemia in her father and mother; Mental illness in her father; Stroke in her father.She reports that she has never smoked. She has never used smokeless tobacco. She reports that she drinks alcohol. She reports that she does not use illicit drugs.  Outpatient Prescriptions Prior to Visit  Medication Sig Dispense Refill  . amoxicillin (AMOXIL) 500 MG capsule 500 mg. Take 4 by mouth prior to dental visit    . aspirin 81 MG tablet Take 81 mg by mouth daily.    . Cholecalciferol 2000 UNITS CAPS Take 1 capsule by mouth daily.    Marland Kitchen ibuprofen (ADVIL,MOTRIN) 800 MG tablet Take 1 tablet (800 mg total) by mouth every 8 (eight) hours as needed. 30 tablet 1  . Multiple Vitamin (MULTIVITAMIN) tablet Take 1 tablet by mouth daily.      . Red Yeast Rice 600 MG CAPS Take 1 capsule (600 mg total) by mouth 2 (two) times daily. 60 capsule 3  . triamcinolone lotion (KENALOG) 0.1 % Apply topically 2 (two) times daily as needed. Reported on 01/31/2015    . mometasone (NASONEX) 50 MCG/ACT nasal spray Place 2 sprays into the nose daily. 17 g 12  . nadolol (CORGARD) 40 MG tablet Take by mouth.    Marland Kitchen amoxicillin (AMOXIL) 875 MG tablet Take 1 tablet (875 mg total) by mouth 2 (two) times daily. 20 tablet 0  . atenolol (TENORMIN) 25 MG tablet Take 25 mg by mouth daily.    . fluconazole (DIFLUCAN) 150 MG tablet Take 1 tablet (150 mg total) by mouth daily. (Patient not taking: Reported on 01/31/2015) 2 tablet 0   No facility-administered medications prior to visit.    Review of Systems   Patient denies headache, fevers, malaise, unintentional weight loss, skin rash, eye pain, sinus congestion and sinus pain, sore throat, dysphagia,  hemoptysis , cough, dyspnea, wheezing, chest pain, palpitations, orthopnea, edema,  abdominal pain, nausea, melena, diarrhea, constipation, flank pain, dysuria, hematuria, urinary  Frequency, nocturia, numbness, tingling, seizures,  Focal weakness, Loss of consciousness,  Tremor, insomnia, depression, anxiety, and suicidal ideation.     Objective:  BP 100/62 mmHg  Pulse 70  Temp(Src) 97.5 F (36.4 C) (Oral)  Resp 12  Ht 5' 3"  (1.6 m)  Wt 166 lb 8 oz (75.524 kg)  BMI 29.50 kg/m2  SpO2 99%  LMP 05/02/2015  Physical Exam   General appearance: alert, cooperative and appears stated age Ears: normal TM's and external ear canals both ears Throat: lips, mucosa, and tongue normal; teeth and gums normal Neck: no adenopathy, no carotid bruit, supple, symmetrical, trachea midline and thyroid not enlarged, symmetric, no tenderness/mass/nodules Back: symmetric, no curvature. ROM normal. No CVA tenderness. Lungs: clear to auscultation bilaterally Heart: regular rate and rhythm, S1, S2 normal, no murmur, click, rub or gallop Abdomen: soft, non-tender; bowel sounds normal; no masses,  no organomegaly Pulses: 2+  and symmetric Skin: Skin color, texture, turgor normal. No rashes or lesions Lymph nodes: Cervical, supraclavicular, and axillary nodes normal.    Assessment & Plan:   Problem List Items Addressed This Visit    Long Q-T syndrome    With history of  VT arrest, now with AICD . Genetic testing done by EP was negative for gene mutations. Continue Corguard. Patient aware of medications that can prloong QT       Routine general medical examination at a health care facility    Annual wellness  exam was done as well as a comprehensive physical exam and management of acute and chronic conditions .  During the course of the visit the patient was educated and counseled about appropriate screening and preventive services including :  diabetes screening, lipid analysis with projected  10 year  risk for CAD , nutrition counseling, colorectal cancer screening, and recommended  immunizations.  Printed recommendations for health maintenance screenings was given.   Lab Results  Component Value Date   CHOL 219* 11/29/2014   HDL 49.10 11/29/2014   LDLCALC 150* 11/29/2014   LDLDIRECT 147.3 02/21/2013   TRIG 100.0 11/29/2014   CHOLHDL 4 11/29/2014   Lab Results  Component Value Date   TSH 1.84 02/23/2014   Lab Results  Component Value Date   CREATININE 0.79 11/29/2014   Lab Results  Component Value Date   NA 139 11/29/2014   K 4.5 11/29/2014   CL 105 11/29/2014   CO2 29 11/29/2014   Lab Results  Component Value Date   ALT 11 11/29/2014   AST 15 11/29/2014   ALKPHOS 45 11/29/2014   BILITOT 0.5 11/29/2014           Breast calcifications on mammogram    Repeat mammogram was normal.  Continue annual screening with 3 D         Hyperlipidemia    LDL and triglycerides are at goal on current medications.10% risk of CAD is 3% . She has no side effects and liver enzymes are normal. continue red yeast rice.  Lab Results  Component Value Date   CHOL 219* 11/29/2014   HDL 49.10 11/29/2014   LDLCALC 150* 11/29/2014   LDLDIRECT 147.3 02/21/2013   TRIG 100.0 11/29/2014   CHOLHDL 4 11/29/2014          Overweight    I have addressed  BMI and recommended wt loss of 10% of body weigh over the next 6 months using a low fat diet and regular exercise a minimum of 5 days per week.        Posterior left knee pain - Primary    Subacute, started after engaging in a  new activity of riding a recumbent stationery bike for  7 miles. Her pain was described as occurring posteriorly in the popliteal area,  Knee felt tight but was not visibly swollen.  Used NSAIDs and ice and essential oils.   Refer to [facility] for evaluation of symptoms described as a feeling of new onset joint laxity and risk of hyperextension       Relevant Orders   Ambulatory referral to Sports Medicine      I have discontinued Ms. Bento's atenolol and fluconazole. I am also having  her maintain her multivitamin, aspirin, triamcinolone lotion, amoxicillin, ibuprofen, mometasone, Cholecalciferol, Red Yeast Rice, nadolol, and ALPRAZolam.  Meds ordered this encounter  Medications  . ALPRAZolam (XANAX) 0.25 MG tablet    Sig: Take 0.25 mg by mouth at bedtime  as needed for anxiety.    Medications Discontinued During This Encounter  Medication Reason  . amoxicillin (AMOXIL) 875 MG tablet Completed Course  . atenolol (TENORMIN) 25 MG tablet Change in therapy  . atenolol (TENORMIN) 25 MG tablet Change in therapy  . fluconazole (DIFLUCAN) 150 MG tablet     Follow-up: Return in about 6 months (around 11/14/2015).   Crecencio Mc, MD

## 2015-05-14 NOTE — Patient Instructions (Addendum)
I am referring you to Dr [facility] to help determine whether there is a persistent tendon or ligament injury in your left knee   The  diet I discussed with you today is the 10 day Green Smoothie Cleansing /Detox Diet by Linden Dolin . available on [place] for around $10.(also sold locally at [employer])   It does require a blender, (Vita Mix, a electric juicer,  Or a Nutribullet Rx).  This is not a low carb or a weight loss diet,  It is fundamentally a "cleansing" low fat diet that eliminates sugar, gluten, caffeine, alcohol and dairy for 10 days .  What you add back after the initial ten days is entirely up to  you!  You can expect to lose 5 to 10 lbs depending on how strict you are.   I found that  drinking 2 smoothies or juices  daily and keeping one chewable meal (but keep it simple, like baked fish and salad, rice or bok choy) kept me satisfied and kept me from straying  .  You snack primarily on fresh  fruit, egg whites and judicious quantities of nuts.  You can add a  vegetable based protein powder  to any smoothie made with almond milk (nothing with whey , since whey is dairy)  [employer] has a few but  the [employer] has the greatest  selection .    Using frozen fruits is much more convenient and cost effective. You can even find plenty of organic fruit in the frozen fruit section of [employer].  Just thaw what you need for the following day the night before in the refrigerator (to avoid jamming up your machine)   The organic vegan protein powder I tried  is called Vega" and I found it at [employer] .  It is sugar free. Tastes like crap.  My advice:  If you can't find a decent tasting protein powder , Chew your protein  (eat an egg or two in the am with your smoothie or add soy yogurt for protein ) ,  Don't ruin the taste of your smoothies with protein powder unless you can find one you really love.

## 2015-05-14 NOTE — Progress Notes (Signed)
Pre-visit discussion using our clinic review tool. No additional management support is needed unless otherwise documented below in the visit note.  

## 2015-05-15 NOTE — Assessment & Plan Note (Signed)
I have addressed  BMI and recommended wt loss of 10% of body weigh over the next 6 months using a low fat diet and regular exercise a minimum of 5 days per week.

## 2015-05-15 NOTE — Assessment & Plan Note (Signed)
Subacute, started after engaging in a  new activity of riding a recumbent stationery bike for  7 miles. Her pain was described as occurring posteriorly in the popliteal area,  Knee felt tight but was not visibly swollen.  Used NSAIDs and ice and essential oils.   Refer to [facility] for evaluation of symptoms described as a feeling of new onset joint laxity and risk of hyperextension

## 2015-05-15 NOTE — Assessment & Plan Note (Signed)
Repeat mammogram was normal.  Continue annual screening with 3 D

## 2015-05-15 NOTE — Assessment & Plan Note (Signed)
Annual wellness  exam was done as well as a comprehensive physical exam and management of acute and chronic conditions .  During the course of the visit the patient was educated and counseled about appropriate screening and preventive services including :  diabetes screening, lipid analysis with projected  10 year  risk for CAD , nutrition counseling, colorectal cancer screening, and recommended immunizations.  Printed recommendations for health maintenance screenings was given.   Lab Results  Component Value Date   CHOL 219* 11/29/2014   HDL 49.10 11/29/2014   LDLCALC 150* 11/29/2014   LDLDIRECT 147.3 02/21/2013   TRIG 100.0 11/29/2014   CHOLHDL 4 11/29/2014   Lab Results  Component Value Date   TSH 1.84 02/23/2014   Lab Results  Component Value Date   CREATININE 0.79 11/29/2014   Lab Results  Component Value Date   NA 139 11/29/2014   K 4.5 11/29/2014   CL 105 11/29/2014   CO2 29 11/29/2014   Lab Results  Component Value Date   ALT 11 11/29/2014   AST 15 11/29/2014   ALKPHOS 45 11/29/2014   BILITOT 0.5 11/29/2014

## 2015-05-15 NOTE — Assessment & Plan Note (Signed)
LDL and triglycerides are at goal on current medications.10% risk of CAD is 3% . She has no side effects and liver enzymes are normal. continue red yeast rice.  Lab Results  Component Value Date   CHOL 219* 11/29/2014   HDL 49.10 11/29/2014   LDLCALC 150* 11/29/2014   LDLDIRECT 147.3 02/21/2013   TRIG 100.0 11/29/2014   CHOLHDL 4 11/29/2014

## 2015-05-15 NOTE — Assessment & Plan Note (Addendum)
With history of  VT arrest, now with AICD . Genetic testing done by EP was negative for gene mutations. Continue Corguard. Patient aware of medications that can prloong QT

## 2015-05-30 ENCOUNTER — Ambulatory Visit (INDEPENDENT_AMBULATORY_CARE_PROVIDER_SITE_OTHER): Payer: 59 | Admitting: Family Medicine

## 2015-05-30 ENCOUNTER — Other Ambulatory Visit (INDEPENDENT_AMBULATORY_CARE_PROVIDER_SITE_OTHER): Payer: 59

## 2015-05-30 ENCOUNTER — Encounter: Payer: Self-pay | Admitting: Family Medicine

## 2015-05-30 VITALS — BP 120/82 | HR 69 | Ht 63.0 in | Wt 164.0 lb

## 2015-05-30 DIAGNOSIS — M25562 Pain in left knee: Secondary | ICD-10-CM | POA: Diagnosis not present

## 2015-05-30 DIAGNOSIS — S86812A Strain of other muscle(s) and tendon(s) at lower leg level, left leg, initial encounter: Secondary | ICD-10-CM

## 2015-05-30 DIAGNOSIS — S86119A Strain of other muscle(s) and tendon(s) of posterior muscle group at lower leg level, unspecified leg, initial encounter: Secondary | ICD-10-CM | POA: Insufficient documentation

## 2015-05-30 DIAGNOSIS — S86112A Strain of other muscle(s) and tendon(s) of posterior muscle group at lower leg level, left leg, initial encounter: Secondary | ICD-10-CM

## 2015-05-30 MED ORDER — DICLOFENAC SODIUM 2 % TD SOLN: 2.0000 "application " | Freq: Two times a day (BID) | TRANSDERMAL | Status: DC

## 2015-05-30 NOTE — Progress Notes (Signed)
Pre visit review using our clinic review tool, if applicable. No additional management support is needed unless otherwise documented below in the visit note. 

## 2015-05-30 NOTE — Patient Instructions (Addendum)
Good to see you  Ice 20 minutes 2 times daily. Usually after activity and before bed. Exercises 3 times a week.  Compression socks or compression sleeve would be great with activity and with work.  pennsaid pinkie amount topically 2 times daily as needed.  Vitamin D 2000 IU daily to help with strength  Get the right position on the bike.  Always have some bend in your knee Try a heel lift in your left shoe as well See me again in 3-4 weeks to make sure healing well.

## 2015-05-30 NOTE — Assessment & Plan Note (Signed)
Patient seems to have more of a gastrocnemius muscle tear at the musculotendinous juncture. I think that this is not been rehabbed appropriately. Given topical anti-inflammatories secondary to patient's comorbidities. Home exercises with athletic trainer. We discussed icing regimen. Discussed compression as well as a heel lift. Discussed that this will likely take up to 6 weeks to fully heal. Patient will follow-up with me again in 4 weeks to make sure that she is responding appropriately.

## 2015-05-30 NOTE — Progress Notes (Signed)
[facility] [address] [place], [place] [address] Phone: [phone] Subjective:    I'm seeing this patient by the request  of:  TULLO, Aris Everts, MD   CC: left knee  [id number] Deaira Leckey is a 45 y.o. female coming in with complaint of left knee pain. Patient states 3 months ago she was at the gym. Not have pain with it but the next day she had severe 9 out of 10 pain. Was unable to walk for a couple days. Since then slowly improving but anytime she tries to increase her activity she has worsening pain. States to be on the posterior aspect of the leg. Denies any swelling. States that she does feel like she has some instability of the knee. Patient denies any locking or giving out on her though at this time. Has not had any falls. Does not remember any true trauma to the knee. Has not really tried any home modalities.     Past Medical History  Diagnosis Date  . Long Q-T syndrome 2009    with Sudden Cardiac arrest  . Mitral valve prolapse   . Hx-sudden cardiac arrest 2009    secondary to Long QT Syndrome   Past Surgical History  Procedure Laterality Date  . Cardiac defibrillator placement  2009  . Cesarean section  2007 and 2005  . Breast surgery  2001    Benign  . Cardiac defibrillator placement  June 2013   Social History   Social History  . Marital Status: Married    Spouse Name: N/A  . Number of Children: N/A  . Years of Education: N/A   Social History Main Topics  . Smoking status: Never Smoker   . Smokeless tobacco: Never Used  . Alcohol Use: Yes     Comment: occassionally  . Drug Use: No  . Sexual Activity: Not on file   Other Topics Concern  . Not on file   Social History Narrative   Allergies  Allergen Reactions  . Morphine And Related     GI - vomitting   Family History  Problem Relation Age of Onset  . Hyperlipidemia Mother   . Cancer Mother 87    melanoma  . Arthritis Mother     bilateral total knee  replacements  . Heart disease Father     CABG x 3   . Mental illness Father     Alzheimers Dementia  . Hyperlipidemia Father   . Stroke Father     ich    Past medical history, social, surgical and family history all reviewed in electronic medical record.  No pertanent information unless stated regarding to the chief complaint.   Review of Systems: No headache, visual changes, nausea, vomiting, diarrhea, constipation, dizziness, abdominal pain, skin rash, fevers, chills, night sweats, weight loss, swollen lymph nodes, body aches, joint swelling, muscle aches, chest pain, shortness of breath, mood changes.   Objective Last menstrual period 05/02/2015.  General: No apparent distress alert and oriented x3 mood and affect normal, dressed appropriately.  HEENT: Pupils equal, extraocular movements intact  Respiratory: Patient's speak in full sentences and does not appear short of breath  Cardiovascular: No lower extremity edema, non tender, no erythema  Skin: Warm dry intact with no signs of infection or rash on extremities or on axial skeleton.  Abdomen: Soft nontender  Neuro: Cranial nerves II through XII are intact, neurovascularly intact in all extremities with 2+ DTRs and 2+ pulses.  Lymph: No lymphadenopathy  of posterior or anterior cervical chain or axillae bilaterally.  Gait normal with good balance and coordination.  MSK:  Non tender with full range of motion and good stability and symmetric strength and tone of shoulders, elbows, wrist, hip, and ankles bilaterally.  Knee:left  Normal to inspection with no erythema or effusion or obvious bony abnormalities. Tender of the medial popliteal fossa ROM full in flexion and extension and lower leg rotation. Mild laxity with pitted tests but all ligaments seemed to be intact Negative Mcmurray's, Apley's, and Thessalonian tests. Non painful patellar compression. Patellar glide with mildcrepitus. Patellar and quadriceps tendons  unremarkable. Hamstring and quadriceps strength is normal.  Tender over the proximal medial gastroc head Contralateral knee unremarkable  MSK US performed of: left knee This study was ordered, performed, and interpreted by Charlann Boxer D.O.  Knee: All structures visualized. Anteromedial, anterolateral, posteromedial, and posterolateral menisci unremarkable without tearing, fraying, effusion, or displacement. Patient's medial gastrocnemius muscle near origin does have a tear noted. Increasing Doppler flow. Mild hypoechoic changes. Patellar Tendon unremarkable on long and transverse views without effusion. No abnormality of prepatellar bursa. LCL and MCL unremarkable on long and transverse views. No abnormality of origin of medial or lateral head of the gastrocnemius.  IMPRESSION: medial gastrocnemius strain  Procedure note 40370; 15 minutes spent for Therapeutic exercises as stated in above notes.  This included exercises focusing on stretching, strengthening, with significant focus on eccentric aspects. Patient given eccentric exercises focusing on strengthening of thegastrocnemius muscle. We also discussed hamstring stabilization and quadriceps focusing on the vastus medialis oblique.  Proper technique shown and discussed handout in great detail with ATC.  All questions were discussed and answered.     Impression and Recommendations:     This case required medical decision making of moderate complexity.      Note: This dictation was prepared with Dragon dictation along with smaller phrase technology. Any transcriptional errors that result from this process are unintentional.

## 2015-06-12 ENCOUNTER — Encounter: Payer: BLUE CROSS/BLUE SHIELD | Admitting: Obstetrics and Gynecology

## 2015-06-18 ENCOUNTER — Encounter: Payer: Self-pay | Admitting: Family Medicine

## 2015-06-18 ENCOUNTER — Ambulatory Visit (INDEPENDENT_AMBULATORY_CARE_PROVIDER_SITE_OTHER): Payer: 59 | Admitting: Family Medicine

## 2015-06-18 VITALS — BP 118/80 | HR 72 | Wt 163.0 lb

## 2015-06-18 DIAGNOSIS — S86812D Strain of other muscle(s) and tendon(s) at lower leg level, left leg, subsequent encounter: Secondary | ICD-10-CM | POA: Diagnosis not present

## 2015-06-18 DIAGNOSIS — M7150 Other bursitis, not elsewhere classified, unspecified site: Secondary | ICD-10-CM | POA: Diagnosis not present

## 2015-06-18 DIAGNOSIS — M7050 Other bursitis of knee, unspecified knee: Secondary | ICD-10-CM | POA: Insufficient documentation

## 2015-06-18 DIAGNOSIS — S86112D Strain of other muscle(s) and tendon(s) of posterior muscle group at lower leg level, left leg, subsequent encounter: Secondary | ICD-10-CM

## 2015-06-18 NOTE — Patient Instructions (Signed)
Good to see yo u Ice 20 minutes after activity to the fron of the leg  Pes anserine bursitis Thigh compression sleeve can help with activity  pennsaid pinkie amount topically 2 times daily as needed.  Only do calf exercises 1-2 times a week If going downhill go backwards down the hill for next couple weeks.  See me again in 3 weeks if not completely better

## 2015-06-18 NOTE — Assessment & Plan Note (Signed)
New problem. We discussed changing fulcrum of tension with a thigh compression sleeve, topical anti-inflammatories, icing protocol. Patient will try to avoid certain exacerbating factors. Follow-up in 3 weeks if not better we'll consider injection.

## 2015-06-18 NOTE — Assessment & Plan Note (Signed)
Seems that this time. Patient is doing. Well. Discuss continuing to do the exercises on a regular basis. Discussed icing regimen. Follow-up in 3 weeks if pain continues.

## 2015-06-18 NOTE — Progress Notes (Signed)
[facility] [address] [place], [place] [address] Phone: [phone] Subjective:    I'm seeing this patient by the request  of:  TULLO, TERESA L, MD   CC: left knee f/u  [id number] Alyssa Melendez is a 45 y.o. female coming in with complaint of left knee pain. Patient was seen previously and was diagnosed with more pain gastrocnemius tear. Patient states that this is 100% better at this point.  Patient is having more pain on the anterior medial aspect. Right underneath the knee. Worse when going down hills. States that he can be uncomfortable when going from a seated to staining position that seemed to get better with activity. Sometimes seems to be swelling in the area. Very mild compared to her previous pain.     Past Medical History  Diagnosis Date  . Long Q-T syndrome 2009    with Sudden Cardiac arrest  . Mitral valve prolapse   . Hx-sudden cardiac arrest 2009    secondary to Long QT Syndrome  . Complex ovarian cyst   . Congestive heart failure ([facility])   . OAB (overactive bladder)   . LLQ pain   . Increased BMI   . Barlow syndrome   . Fibroadenoma    Past Surgical History  Procedure Laterality Date  . Cardiac defibrillator placement  2009  . Cesarean section  2007 and 2005  . Breast surgery  2001    Benign  . Cardiac defibrillator placement  June 2013  . Mitral valve repair     Social History   Social History  . Marital Status: Married    Spouse Name: N/A  . Number of Children: N/A  . Years of Education: N/A   Social History Main Topics  . Smoking status: Never Smoker   . Smokeless tobacco: Never Used  . Alcohol Use: Yes     Comment: occassionally  . Drug Use: No  . Sexual Activity: Yes     Comment: vasectomy   Other Topics Concern  . None   Social History Narrative   Allergies  Allergen Reactions  . Morphine And Related     GI - vomitting   Family History  Problem Relation Age of Onset  . Hyperlipidemia  Mother   . Cancer Mother 6    melanoma  . Arthritis Mother     bilateral total knee replacements  . Heart disease Father     CABG x 3   . Mental illness Father     Alzheimers Dementia  . Hyperlipidemia Father   . Stroke Father     ich  . Breast cancer Neg Hx   . Ovarian cancer Neg Hx   . Colon cancer Neg Hx     Past medical history, social, surgical and family history all reviewed in electronic medical record.  No pertanent information unless stated regarding to the chief complaint.   Review of Systems: No headache, visual changes, nausea, vomiting, diarrhea, constipation, dizziness, abdominal pain, skin rash, fevers, chills, night sweats, weight loss, swollen lymph nodes, body aches, joint swelling, muscle aches, chest pain, shortness of breath, mood changes.   Objective Blood pressure 118/80, pulse 72, weight 163 lb (73.936 kg).  General: No apparent distress alert and oriented x3 mood and affect normal, dressed appropriately.  HEENT: Pupils equal, extraocular movements intact  Respiratory: Patient's speak in full sentences and does not appear short of breath  Cardiovascular: No lower extremity edema, non tender, no erythema  Skin: Warm dry  intact with no signs of infection or rash on extremities or on axial skeleton.  Abdomen: Soft nontender  Neuro: Cranial nerves II through XII are intact, neurovascularly intact in all extremities with 2+ DTRs and 2+ pulses.  Lymph: No lymphadenopathy of posterior or anterior cervical chain or axillae bilaterally.  Gait normal with good balance and coordination.  MSK:  Non tender with full range of motion and good stability and symmetric strength and tone of shoulders, elbows, wrist, hip, and ankles bilaterally.  Knee:left  Normal to inspection with no erythema or effusion or obvious bony abnormalities. Tender of the Pes anserine area but no longer over the gastrocnemius muscle ROM full in flexion and extension and lower leg rotation. Mild  laxity MCL testing Negative Mcmurray's, Apley's, and Thessalonian tests. Non painful patellar compression. Patellar glide with mildcrepitus. Patellar and quadriceps tendons unremarkable. Hamstring and quadriceps strength is normal.  Contralateral knee unremarkable  MSK US performed of: left knee This study was ordered, performed, and interpreted by Charlann Boxer D.O.  Knee: All structures visualized. Anteromedial, anterolateral, posteromedial, and posterolateral menisci unremarkable without tearing, fraying, effusion, or displacement. Patient does have swelling though of the pes anserine bursa Patient's medial gastrocnemius muscle near origin does have have mild scar tissue formation noted but seems to be improving. No longer having any hypoechoic changes No abnormality of prepatellar bursa. LCL and MCL unremarkable on long and transverse views. No abnormality of origin of medial or lateral head of the gastrocnemius. Pictures saved in internal hard drive IMPRESSION: Healed gastrocnemius with mild presence ring bursa     Impression and Recommendations:     This case required medical decision making of moderate complexity.      Note: This dictation was prepared with Dragon dictation along with smaller phrase technology. Any transcriptional errors that result from this process are unintentional.

## 2015-06-21 ENCOUNTER — Encounter: Payer: BLUE CROSS/BLUE SHIELD | Admitting: Obstetrics and Gynecology

## 2015-07-10 ENCOUNTER — Ambulatory Visit: Payer: Self-pay | Admitting: Family Medicine

## 2015-11-14 ENCOUNTER — Ambulatory Visit (INDEPENDENT_AMBULATORY_CARE_PROVIDER_SITE_OTHER): Payer: 59 | Admitting: Internal Medicine

## 2015-11-14 VITALS — BP 102/66 | HR 70 | Temp 97.5°F | Resp 12 | Ht 63.0 in | Wt 166.5 lb

## 2015-11-14 DIAGNOSIS — R5383 Other fatigue: Secondary | ICD-10-CM

## 2015-11-14 DIAGNOSIS — E785 Hyperlipidemia, unspecified: Secondary | ICD-10-CM

## 2015-11-14 DIAGNOSIS — Z1239 Encounter for other screening for malignant neoplasm of breast: Secondary | ICD-10-CM | POA: Diagnosis not present

## 2015-11-14 DIAGNOSIS — E559 Vitamin D deficiency, unspecified: Secondary | ICD-10-CM | POA: Diagnosis not present

## 2015-11-14 DIAGNOSIS — Z0000 Encounter for general adult medical examination without abnormal findings: Secondary | ICD-10-CM

## 2015-11-14 DIAGNOSIS — E663 Overweight: Secondary | ICD-10-CM

## 2015-11-14 LAB — COMPREHENSIVE METABOLIC PANEL
ALBUMIN: 4 g/dL (ref 3.5–5.2)
ALT: 13 U/L (ref 0–35)
AST: 18 U/L (ref 0–37)
Alkaline Phosphatase: 46 U/L (ref 39–117)
BILIRUBIN TOTAL: 0.4 mg/dL (ref 0.2–1.2)
BUN: 15 mg/dL (ref 6–23)
CALCIUM: 8.8 mg/dL (ref 8.4–10.5)
CHLORIDE: 103 meq/L (ref 96–112)
CO2: 31 mEq/L (ref 19–32)
Creatinine, Ser: 0.87 mg/dL (ref 0.40–1.20)
GFR: 75.44 mL/min (ref >60.00)
Glucose, Bld: 85 mg/dL (ref 70–99)
Potassium: 4.1 mEq/L (ref 3.5–5.1)
Sodium: 138 mEq/L (ref 135–145)
Total Protein: 6.7 g/dL (ref 6.0–8.3)

## 2015-11-14 LAB — CBC WITH DIFFERENTIAL/PLATELET
BASOS ABS: 0 10*3/uL (ref 0.0–0.1)
Basophils Relative: 0.8 % (ref 0.0–3.0)
Eosinophils Absolute: 0.2 10*3/uL (ref 0.0–0.7)
Eosinophils Relative: 3.7 % (ref 0.0–5.0)
HCT: 40.3 % (ref 36.0–46.0)
Hemoglobin: 13.5 g/dL (ref 12.0–15.0)
LYMPHS ABS: 1.2 10*3/uL (ref 0.7–4.0)
Lymphocytes Relative: 23.7 % (ref 12.0–46.0)
MCHC: 33.6 g/dL (ref 30.0–36.0)
MCV: 91.3 fl (ref 78.0–100.0)
MONOS PCT: 11.4 % (ref 3.0–12.0)
Monocytes Absolute: 0.6 10*3/uL (ref 0.1–1.0)
NEUTROS ABS: 2.9 10*3/uL (ref 1.4–7.7)
NEUTROS PCT: 60.4 % (ref 43.0–77.0)
PLATELETS: 253 10*3/uL (ref 150.0–400.0)
RBC: 4.41 Mil/uL (ref 3.87–5.11)
RDW: 13.5 % (ref 11.5–15.5)
WBC: 4.9 10*3/uL (ref 4.0–10.5)

## 2015-11-14 LAB — LIPID PANEL
Cholesterol: 215 mg/dL — ABNORMAL HIGH (ref 0–200)
HDL: 51.7 mg/dL (ref >39.00)
LDL Cholesterol: 147 mg/dL — ABNORMAL HIGH (ref 0–99)
NonHDL: 162.83
TRIGLYCERIDES: 81 mg/dL (ref 0.0–149.0)
Total CHOL/HDL Ratio: 4
VLDL: 16.2 mg/dL (ref 0.0–40.0)

## 2015-11-14 LAB — VITAMIN D 25 HYDROXY (VIT D DEFICIENCY, FRACTURES): VITD: 44.75 ng/mL (ref 30.00–100.00)

## 2015-11-14 LAB — TSH: TSH: 1.26 u[IU]/mL (ref 0.35–4.50)

## 2015-11-14 NOTE — Progress Notes (Signed)
Pre-visit discussion using our clinic review tool. No additional management support is needed unless otherwise documented below in the visit note.  

## 2015-11-14 NOTE — Progress Notes (Signed)
Patient ID: Alyssa Melendez, female    DOB: 09/14/1970  Age: 45 y.o. MRN: [id number]  The patient is here for annual Medicare wellness examination and management of other chronic and acute problems.  PAP smear was normal , every 3 year 2015  Mammogram needed . [place],      The risk factors are reflected in the social history.  The roster of all physicians providing medical care to patient - is listed in the Snapshot section of the chart.  Home safety : The patient has smoke detectors in the home. They wear seatbelts.  There are no firearms at home. There is no violence in the home.   There is no risks for hepatitis, STDs or HIV. There is no   history of blood transfusion. They have no travel history to infectious disease endemic areas of the world.  The patient has seen their dentist in the last six month. They have seen their eye doctor in the last year.    They do not  have excessive sun exposure. Discussed the need for sun protection: hats, long sleeves and use of sunscreen if there is significant sun exposure.   Diet: the importance of a healthy diet is discussed. They do have a healthy diet.  The benefits of regular aerobic exercise were discussed. She walks 4 times per week ,  20 minutes.   Depression screen: there are no signs or vegative symptoms of depression- irritability, change in appetite, anhedonia, sadness/tearfullness.  The following portions of the patient's history were reviewed and updated as appropriate: allergies, current medications, past family history, past medical history,  past surgical history, past social history  and problem list.  Visual acuity was not assessed per patient preference since she has regular follow up with her ophthalmologist. Hearing and body mass index were assessed and reviewed.   During the course of the visit the patient was educated and counseled about appropriate screening and preventive services including : fall prevention , diabetes  screening, nutrition counseling, colorectal cancer screening, and recommended immunizations.    CC: The primary encounter diagnosis was Other fatigue. Diagnoses of Vitamin D deficiency, Hyperlipidemia, Breast cancer screening, Overweight, and Routine general medical examination at a health care facility were also pertinent to this visit.   Weight gain:  Body mass index is 29.49 kg/m.Marland Kitchen     Dietary measures to reduce BMI were reviewed in detail with patient today .  She had success on the Green Smoothie Detix diet and lost 15 lbs but gained it after stopping.  Exercise routine discussed in detail. .  She is not exercising  Intensely enough due to the noton that her heart has chronic ischemic damage and cannot tolerate workouts that include incline for fear of being short of breath              History Alyssa Melendez has a past medical history of Barlow syndrome; Complex ovarian cyst; Congestive heart failure ([place]); Fibroadenoma; sudden cardiac arrest (2009); Increased BMI; LLQ pain; Long Q-T syndrome (2009); Mitral valve prolapse; and OAB (overactive bladder).   She has a past surgical history that includes Cardiac defibrillator placement (2009); Cesarean section (2007 and 2005); Breast surgery (2001); Cardiac defibrillator placement (June 2013); and Mitral valve repair.   Her family history includes Arthritis in her mother; Cancer (age of onset: 51) in her mother; Heart disease in her father; Hyperlipidemia in her father and mother; Mental illness in her father; Stroke in her father.She reports that she has never smoked. She  has never used smokeless tobacco. She reports that she drinks alcohol. She reports that she does not use drugs.  Outpatient Medications Prior to Visit  Medication Sig Dispense Refill  . ALPRAZolam (XANAX) 0.25 MG tablet Take 0.25 mg by mouth at bedtime as needed for anxiety.    Marland Kitchen amoxicillin (AMOXIL) 500 MG capsule 500 mg. Take 4 by mouth prior to dental visit    . aspirin 81 MG  tablet Take 81 mg by mouth daily.    . Cholecalciferol 2000 UNITS CAPS Take 1 capsule by mouth daily.    Marland Kitchen ibuprofen (ADVIL,MOTRIN) 800 MG tablet Take 1 tablet (800 mg total) by mouth every 8 (eight) hours as needed. 30 tablet 1  . Multiple Vitamin (MULTIVITAMIN) tablet Take 1 tablet by mouth daily.      Marland Kitchen triamcinolone lotion (KENALOG) 0.1 % Apply topically 2 (two) times daily as needed. Reported on 01/31/2015    . mometasone (NASONEX) 50 MCG/ACT nasal spray Place 2 sprays into the nose daily. 17 g 12  . nadolol (CORGARD) 40 MG tablet Take by mouth.    . Red Yeast Rice 600 MG CAPS Take 1 capsule (600 mg total) by mouth 2 (two) times daily. (Patient not taking: Reported on 11/14/2015) 60 capsule 3  . Diclofenac Sodium (PENNSAID) 2 % SOLN Place 2 application onto the skin 2 (two) times daily. (Patient not taking: Reported on 11/14/2015) 112 g 3   No facility-administered medications prior to visit.     Review of Systems   Patient denies headache, fevers, malaise, unintentional weight loss, skin rash, eye pain, sinus congestion and sinus pain, sore throat, dysphagia,  hemoptysis , cough, dyspnea, wheezing, chest pain, palpitations, orthopnea, edema, abdominal pain, nausea, melena, diarrhea, constipation, flank pain, dysuria, hematuria, urinary  Frequency, nocturia, numbness, tingling, seizures,  Focal weakness, Loss of consciousness,  Tremor, insomnia, depression, anxiety, and suicidal ideation.     Objective:  BP 102/66   Pulse 70   Temp 97.5 F (36.4 C) (Oral)   Resp 12   Ht 5' 3"  (1.6 m)   Wt 166 lb 8 oz (75.5 kg)   LMP 11/02/2015 (Exact Date)   SpO2 98%   BMI 29.49 kg/m   Physical Exam   General appearance: alert, cooperative and appears stated age Ears: normal TM's and external ear canals both ears Throat: lips, mucosa, and tongue normal; teeth and gums normal Neck: no adenopathy, no carotid bruit, supple, symmetrical, trachea midline and thyroid not enlarged, symmetric, no  tenderness/mass/nodules Back: symmetric, no curvature. ROM normal. No CVA tenderness. Lungs: clear to auscultation bilaterally Heart: regular rate and rhythm, S1, S2 normal, no murmur, click, rub or gallop Abdomen: soft, non-tender; bowel sounds normal; no masses,  no organomegaly Pulses: 2+ and symmetric Skin: Skin color, texture, turgor normal. No rashes or lesions Lymph nodes: Cervical, supraclavicular, and axillary nodes normal.   Assessment & Plan:   Problem List Items Addressed This Visit    Routine general medical examination at a health care facility    Annual comprehensive preventive exam was done as well as an evaluation and management of chronic conditions .  During the course of the visit the patient was educated and counseled about appropriate screening and preventive services including :  diabetes screening, lipid analysis with projected  10 year  risk for CAD , nutrition counseling, breast, cervical and colorectal cancer screening, and recommended immunizations.  Printed recommendations for health maintenance screenings was given      Overweight  I have addressed  BMI and recommended wt loss of 10% of body weigh over the next 6 months using a low glycemic index diet and increased intensity of  regular exercise a minimum of 5 days per week.        Hyperlipidemia   Relevant Medications   atenolol (TENORMIN) 25 MG tablet   Other Relevant Orders   Lipid panel (Completed)   Vitamin D deficiency   Relevant Orders   VITAMIN D 25 Hydroxy (Vit-D Deficiency, Fractures) (Completed)    Other Visit Diagnoses    Other fatigue    -  Primary   Relevant Orders   Comprehensive metabolic panel (Completed)   TSH (Completed)   CBC with Differential/Platelet (Completed)   Breast cancer screening       Relevant Orders   MM DIGITAL SCREENING BILATERAL      I have discontinued Ms. Nicol's Diclofenac Sodium. I am also having her maintain her multivitamin, aspirin, triamcinolone  lotion, amoxicillin, ibuprofen, mometasone, Cholecalciferol, Red Yeast Rice, nadolol, ALPRAZolam, and atenolol.  Meds ordered this encounter  Medications  . atenolol (TENORMIN) 25 MG tablet    Sig: Take 25 mg by mouth daily.    Medications Discontinued During This Encounter  Medication Reason  . Diclofenac Sodium (PENNSAID) 2 % SOLN Error    Follow-up: No Follow-up on file.   Crecencio Mc, MD

## 2015-11-14 NOTE — Patient Instructions (Addendum)
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   yOUR FIRST GOAL IS 17 LBS Health Maintenance, Female Adopting a healthy lifestyle and getting preventive care can go a long way to promote health and wellness. Talk with your health care provider about what schedule of regular examinations is right for you. This is a good chance for you to check in with your provider about disease prevention and staying healthy. In between checkups, there are plenty of things you can do on your own. Experts have done a lot of research about which lifestyle changes and preventive measures are most likely to keep you healthy. Ask your health care provider for more information. WEIGHT AND DIET  Eat a healthy diet  Be sure to include plenty of vegetables, fruits, low-fat dairy products, and lean protein.  Do not eat a lot of foods high in solid fats, added sugars, or salt.  Get regular exercise. This is one of the most important things you can do for your health.  Most adults should exercise for at least 150 minutes each week. The exercise should increase your heart rate and make you sweat (moderate-intensity exercise).  Most adults should also do strengthening exercises at least twice a week. This is in addition to the moderate-intensity exercise.  Maintain a healthy weight  Body mass index (BMI) is a measurement that can be used to identify possible weight problems. It estimates body fat based on height and weight. Your health care provider can help determine your BMI and help you achieve or maintain a healthy weight.  For females 20 years of age and older:   A BMI below 18.5 is considered underweight.  A BMI of 18.5 to 24.9 is normal.  A BMI of 25 to 29.9 is considered overweight.  A BMI of 30 and above is considered obese.  Watch levels of cholesterol and blood lipids  You should start having your blood tested for lipids and cholesterol at 43 years of age, then have this test every 5  years.  You may need to have your cholesterol levels checked more often if:  Your lipid or cholesterol levels are high.  You are older than 43 years of age.  You are at high risk for heart disease.  CANCER SCREENING   Lung Cancer  Lung cancer screening is recommended for adults 55-80 years old who are at high risk for lung cancer because of a history of smoking.  A yearly low-dose CT scan of the lungs is recommended for people who:  Currently smoke.  Have quit within the past 15 years.  Have at least a 30-pack-year history of smoking. A pack year is smoking an average of one pack of cigarettes a day for 1 year.  Yearly screening should continue until it has been 15 years since you quit.  Yearly screening should stop if you develop a health problem that would prevent you from having lung cancer treatment.  Breast Cancer  Practice breast self-awareness. This means understanding how your breasts normally appear and feel.  It also means doing regular breast self-exams. Let your health care provider know about any changes, no matter how small.  If you are in your 20s or 30s, you should have a clinical breast exam (CBE) by a health care provider every 1-3 years as part of a regular health exam.  If you are 40 or older, have a CBE every year. Also consider having a breast X-ray (mammogram) every year.  If you have   a family history of breast cancer, talk to your health care provider about genetic screening.  If you are at high risk for breast cancer, talk to your health care provider about having an MRI and a mammogram every year.  Breast cancer gene (BRCA) assessment is recommended for women who have family members with BRCA-related cancers. BRCA-related cancers include:  Breast.  Ovarian.  Tubal.  Peritoneal cancers.  Results of the assessment will determine the need for genetic counseling and BRCA1 and BRCA2 testing. Cervical Cancer Your health care provider may  recommend that you be screened regularly for cancer of the pelvic organs (ovaries, uterus, and vagina). This screening involves a pelvic examination, including checking for microscopic changes to the surface of your cervix (Pap test). You may be encouraged to have this screening done every 3 years, beginning at age 21.  For women ages 30-65, health care providers may recommend pelvic exams and Pap testing every 3 years, or they may recommend the Pap and pelvic exam, combined with testing for human papilloma virus (HPV), every 5 years. Some types of HPV increase your risk of cervical cancer. Testing for HPV may also be done on women of any age with unclear Pap test results.  Other health care providers may not recommend any screening for nonpregnant women who are considered low risk for pelvic cancer and who do not have symptoms. Ask your health care provider if a screening pelvic exam is right for you.  If you have had past treatment for cervical cancer or a condition that could lead to cancer, you need Pap tests and screening for cancer for at least 20 years after your treatment. If Pap tests have been discontinued, your risk factors (such as having a new sexual partner) need to be reassessed to determine if screening should resume. Some women have medical problems that increase the chance of getting cervical cancer. In these cases, your health care provider may recommend more frequent screening and Pap tests. Colorectal Cancer  This type of cancer can be detected and often prevented.  Routine colorectal cancer screening usually begins at 43 years of age and continues through 43 years of age.  Your health care provider may recommend screening at an earlier age if you have risk factors for colon cancer.  Your health care provider may also recommend using home test kits to check for hidden blood in the stool.  A small camera at the end of a tube can be used to examine your colon directly  (sigmoidoscopy or colonoscopy). This is done to check for the earliest forms of colorectal cancer.  Routine screening usually begins at age 50.  Direct examination of the colon should be repeated every 5-10 years through 43 years of age. However, you may need to be screened more often if early forms of precancerous polyps or small growths are found. Skin Cancer  Check your skin from head to toe regularly.  Tell your health care provider about any new moles or changes in moles, especially if there is a change in a mole's shape or color.  Also tell your health care provider if you have a mole that is larger than the size of a pencil eraser.  Always use sunscreen. Apply sunscreen liberally and repeatedly throughout the day.  Protect yourself by wearing long sleeves, pants, a wide-brimmed hat, and sunglasses whenever you are outside. HEART DISEASE, DIABETES, AND HIGH BLOOD PRESSURE   High blood pressure causes heart disease and increases the risk of stroke. High   blood pressure is more likely to develop in:  People who have blood pressure in the high end of the normal range (130-139/85-89 mm Hg).  People who are overweight or obese.  People who are African American.  If you are 18-39 years of age, have your blood pressure checked every 3-5 years. If you are 40 years of age or older, have your blood pressure checked every year. You should have your blood pressure measured twice--once when you are at a hospital or clinic, and once when you are not at a hospital or clinic. Record the average of the two measurements. To check your blood pressure when you are not at a hospital or clinic, you can use:  An automated blood pressure machine at a pharmacy.  A home blood pressure monitor.  If you are between 55 years and 79 years old, ask your health care provider if you should take aspirin to prevent strokes.  Have regular diabetes screenings. This involves taking a blood sample to check your  fasting blood sugar level.  If you are at a normal weight and have a low risk for diabetes, have this test once every three years after 43 years of age.  If you are overweight and have a high risk for diabetes, consider being tested at a younger age or more often. PREVENTING INFECTION  Hepatitis B  If you have a higher risk for hepatitis B, you should be screened for this virus. You are considered at high risk for hepatitis B if:  You were born in a country where hepatitis B is common. Ask your health care provider which countries are considered high risk.  Your parents were born in a high-risk country, and you have not been immunized against hepatitis B (hepatitis B vaccine).  You have HIV or AIDS.  You use needles to inject street drugs.  You live with someone who has hepatitis B.  You have had sex with someone who has hepatitis B.  You get hemodialysis treatment.  You take certain medicines for conditions, including cancer, organ transplantation, and autoimmune conditions. Hepatitis C  Blood testing is recommended for:  Everyone born from 1945 through 1965.  Anyone with known risk factors for hepatitis C. Sexually transmitted infections (STIs)  You should be screened for sexually transmitted infections (STIs) including gonorrhea and chlamydia if:  You are sexually active and are younger than 43 years of age.  You are older than 43 years of age and your health care provider tells you that you are at risk for this type of infection.  Your sexual activity has changed since you were last screened and you are at an increased risk for chlamydia or gonorrhea. Ask your health care provider if you are at risk.  If you do not have HIV, but are at risk, it may be recommended that you take a prescription medicine daily to prevent HIV infection. This is called pre-exposure prophylaxis (PrEP). You are considered at risk if:  You are sexually active and do not regularly use condoms or  know the HIV status of your partner(s).  You take drugs by injection.  You are sexually active with a partner who has HIV. Talk with your health care provider about whether you are at high risk of being infected with HIV. If you choose to begin PrEP, you should first be tested for HIV. You should then be tested every 3 months for as long as you are taking PrEP.  PREGNANCY   If you are   premenopausal and you may become pregnant, ask your health care provider about preconception counseling.  If you may become pregnant, take 400 to 800 micrograms (mcg) of folic acid every day.  If you want to prevent pregnancy, talk to your health care provider about birth control (contraception). OSTEOPOROSIS AND MENOPAUSE   Osteoporosis is a disease in which the bones lose minerals and strength with aging. This can result in serious bone fractures. Your risk for osteoporosis can be identified using a bone density scan.  If you are 43 years of age or older, or if you are at risk for osteoporosis and fractures, ask your health care provider if you should be screened.  Ask your health care provider whether you should take a calcium or vitamin D supplement to lower your risk for osteoporosis.  Menopause may have certain physical symptoms and risks.  Hormone replacement therapy may reduce some of these symptoms and risks. Talk to your health care provider about whether hormone replacement therapy is right for you.  HOME CARE INSTRUCTIONS   Schedule regular health, dental, and eye exams.  Stay current with your immunizations.   Do not use any tobacco products including cigarettes, chewing tobacco, or electronic cigarettes.  If you are pregnant, do not drink alcohol.  If you are breastfeeding, limit how much and how often you drink alcohol.  Limit alcohol intake to no more than 1 drink per day for nonpregnant women. One drink equals 12 ounces of beer, 5 ounces of wine, or 1 ounces of hard liquor.  Do  not use street drugs.  Do not share needles.  Ask your health care provider for help if you need support or information about quitting drugs.  Tell your health care provider if you often feel depressed.  Tell your health care provider if you have ever been abused or do not feel safe at home.   This information is not intended to replace advice given to you by your health care provider. Make sure you discuss any questions you have with your health care provider.   Document Released: 08/19/2010 Document Revised: 02/24/2014 Document Reviewed: 01/05/2013 Elsevier Interactive Patient Education [employer].

## 2015-11-15 ENCOUNTER — Encounter: Payer: Self-pay | Admitting: Internal Medicine

## 2015-11-15 NOTE — Assessment & Plan Note (Signed)
Annual comprehensive preventive exam was done as well as an evaluation and management of chronic conditions .  During the course of the visit the patient was educated and counseled about appropriate screening and preventive services including :  diabetes screening, lipid analysis with projected  10 year  risk for CAD , nutrition counseling, breast, cervical and colorectal cancer screening, and recommended immunizations.  Printed recommendations for health maintenance screenings was given

## 2015-11-15 NOTE — Assessment & Plan Note (Signed)
I have addressed  BMI and recommended wt loss of 10% of body weigh over the next 6 months using a low glycemic index diet and increased intensity of  regular exercise a minimum of 5 days per week.

## 2015-11-30 NOTE — Telephone Encounter (Signed)
Mailed unread message to patient.  

## 2015-12-12 ENCOUNTER — Ambulatory Visit
Admission: RE | Admit: 2015-12-12 | Discharge: 2015-12-12 | Disposition: A | Discharge status: Home / Self Care | Payer: 59 | Source: Ambulatory Visit | Attending: Internal Medicine | Admitting: Internal Medicine

## 2015-12-12 DIAGNOSIS — Z1231 Encounter for screening mammogram for malignant neoplasm of breast: Secondary | ICD-10-CM | POA: Diagnosis present

## 2015-12-13 ENCOUNTER — Encounter: Payer: Self-pay | Admitting: Internal Medicine

## 2015-12-13 ENCOUNTER — Ambulatory Visit: Admission: RE | Admit: 2015-12-13 | Payer: 59 | Source: Ambulatory Visit

## 2015-12-13 ENCOUNTER — Other Ambulatory Visit: Payer: Self-pay | Admitting: Internal Medicine

## 2015-12-13 DIAGNOSIS — N6320 Unspecified lump in the left breast, unspecified quadrant: Secondary | ICD-10-CM

## 2015-12-20 ENCOUNTER — Encounter: Payer: Self-pay | Admitting: Obstetrics and Gynecology

## 2015-12-20 ENCOUNTER — Ambulatory Visit (INDEPENDENT_AMBULATORY_CARE_PROVIDER_SITE_OTHER): Payer: Self-pay | Admitting: Obstetrics and Gynecology

## 2015-12-20 VITALS — BP 93/65 | HR 71 | Ht 63.0 in | Wt 170.0 lb

## 2015-12-20 DIAGNOSIS — Z01419 Encounter for gynecological examination (general) (routine) without abnormal findings: Secondary | ICD-10-CM

## 2015-12-20 DIAGNOSIS — Z309 Encounter for contraceptive management, unspecified: Secondary | ICD-10-CM

## 2015-12-20 DIAGNOSIS — E669 Obesity, unspecified: Secondary | ICD-10-CM

## 2015-12-20 DIAGNOSIS — Z95810 Presence of automatic (implantable) cardiac defibrillator: Secondary | ICD-10-CM | POA: Insufficient documentation

## 2015-12-20 DIAGNOSIS — N907 Vulvar cyst: Secondary | ICD-10-CM

## 2015-12-20 DIAGNOSIS — Z124 Encounter for screening for malignant neoplasm of cervix: Secondary | ICD-10-CM

## 2015-12-20 NOTE — Patient Instructions (Addendum)
1. Pap smear 2. Mammogram ordered 3. Continue with healthy and exercise 4. Contraception- vasectomy 5. Return in 1 year  Health Maintenance, Female Adopting a healthy lifestyle and getting preventive care can go a long way to promote health and wellness. Talk with your health care provider about what schedule of regular examinations is right for you. This is a good chance for you to check in with your provider about disease prevention and staying healthy. In between checkups, there are plenty of things you can do on your own. Experts have done a lot of research about which lifestyle changes and preventive measures are most likely to keep you healthy. Ask your health care provider for more information. WEIGHT AND DIET  Eat a healthy diet  Be sure to include plenty of vegetables, fruits, low-fat dairy products, and lean protein.  Do not eat a lot of foods high in solid fats, added sugars, or salt.  Get regular exercise. This is one of the most important things you can do for your health.  Most adults should exercise for at least 150 minutes each week. The exercise should increase your heart rate and make you sweat (moderate-intensity exercise).  Most adults should also do strengthening exercises at least twice a week. This is in addition to the moderate-intensity exercise.  Maintain a healthy weight  Body mass index (BMI) is a measurement that can be used to identify possible weight problems. It estimates body fat based on height and weight. Your health care provider can help determine your BMI and help you achieve or maintain a healthy weight.  For females 41 years of age and older:   A BMI below 18.5 is considered underweight.  A BMI of 18.5 to 24.9 is normal.  A BMI of 25 to 29.9 is considered overweight.  A BMI of 30 and above is considered obese.  Watch levels of cholesterol and blood lipids  You should start having your blood tested for lipids and cholesterol at 43 years  of age, then have this test every 5 years.  You may need to have your cholesterol levels checked more often if:  Your lipid or cholesterol levels are high.  You are older than 43 years of age.  You are at high risk for heart disease.  CANCER SCREENING   Lung Cancer  Lung cancer screening is recommended for adults 30-27 years old who are at high risk for lung cancer because of a history of smoking.  A yearly low-dose CT scan of the lungs is recommended for people who:  Currently smoke.  Have quit within the past 15 years.  Have at least a 30-pack-year history of smoking. A pack year is smoking an average of one pack of cigarettes a day for 1 year.  Yearly screening should continue until it has been 15 years since you quit.  Yearly screening should stop if you develop a health problem that would prevent you from having lung cancer treatment.  Breast Cancer  Practice breast self-awareness. This means understanding how your breasts normally appear and feel.  It also means doing regular breast self-exams. Let your health care provider know about any changes, no matter how small.  If you are in your 20s or 30s, you should have a clinical breast exam (CBE) by a health care provider every 1-3 years as part of a regular health exam.  If you are 59 or older, have a CBE every year. Also consider having a breast X-ray (mammogram) every year.  If you have a family history of breast cancer, talk to your health care provider about genetic screening.  If you are at high risk for breast cancer, talk to your health care provider about having an MRI and a mammogram every year.  Breast cancer gene (BRCA) assessment is recommended for women who have family members with BRCA-related cancers. BRCA-related cancers include:  Breast.  Ovarian.  Tubal.  Peritoneal cancers.  Results of the assessment will determine the need for genetic counseling and BRCA1 and BRCA2 testing. Cervical  Cancer Your health care provider may recommend that you be screened regularly for cancer of the pelvic organs (ovaries, uterus, and vagina). This screening involves a pelvic examination, including checking for microscopic changes to the surface of your cervix (Pap test). You may be encouraged to have this screening done every 3 years, beginning at age 21.  For women ages 30-65, health care providers may recommend pelvic exams and Pap testing every 3 years, or they may recommend the Pap and pelvic exam, combined with testing for human papilloma virus (HPV), every 5 years. Some types of HPV increase your risk of cervical cancer. Testing for HPV may also be done on women of any age with unclear Pap test results.  Other health care providers may not recommend any screening for nonpregnant women who are considered low risk for pelvic cancer and who do not have symptoms. Ask your health care provider if a screening pelvic exam is right for you.  If you have had past treatment for cervical cancer or a condition that could lead to cancer, you need Pap tests and screening for cancer for at least 20 years after your treatment. If Pap tests have been discontinued, your risk factors (such as having a new sexual partner) need to be reassessed to determine if screening should resume. Some women have medical problems that increase the chance of getting cervical cancer. In these cases, your health care provider may recommend more frequent screening and Pap tests. Colorectal Cancer  This type of cancer can be detected and often prevented.  Routine colorectal cancer screening usually begins at 43 years of age and continues through 43 years of age.  Your health care provider may recommend screening at an earlier age if you have risk factors for colon cancer.  Your health care provider may also recommend using home test kits to check for hidden blood in the stool.  A small camera at the end of a tube can be used to  examine your colon directly (sigmoidoscopy or colonoscopy). This is done to check for the earliest forms of colorectal cancer.  Routine screening usually begins at age 50.  Direct examination of the colon should be repeated every 5-10 years through 43 years of age. However, you may need to be screened more often if early forms of precancerous polyps or small growths are found. Skin Cancer  Check your skin from head to toe regularly.  Tell your health care provider about any new moles or changes in moles, especially if there is a change in a mole's shape or color.  Also tell your health care provider if you have a mole that is larger than the size of a pencil eraser.  Always use sunscreen. Apply sunscreen liberally and repeatedly throughout the day.  Protect yourself by wearing long sleeves, pants, a wide-brimmed hat, and sunglasses whenever you are outside. HEART DISEASE, DIABETES, AND HIGH BLOOD PRESSURE   High blood pressure causes heart disease and increases the risk   of stroke. High blood pressure is more likely to develop in:  People who have blood pressure in the high end of the normal range (130-139/85-89 mm Hg).  People who are overweight or obese.  People who are African American.  If you are 18-39 years of age, have your blood pressure checked every 3-5 years. If you are 40 years of age or older, have your blood pressure checked every year. You should have your blood pressure measured twice--once when you are at a hospital or clinic, and once when you are not at a hospital or clinic. Record the average of the two measurements. To check your blood pressure when you are not at a hospital or clinic, you can use:  An automated blood pressure machine at a pharmacy.  A home blood pressure monitor.  If you are between 55 years and 79 years old, ask your health care provider if you should take aspirin to prevent strokes.  Have regular diabetes screenings. This involves taking a  blood sample to check your fasting blood sugar level.  If you are at a normal weight and have a low risk for diabetes, have this test once every three years after 43 years of age.  If you are overweight and have a high risk for diabetes, consider being tested at a younger age or more often. PREVENTING INFECTION  Hepatitis B  If you have a higher risk for hepatitis B, you should be screened for this virus. You are considered at high risk for hepatitis B if:  You were born in a country where hepatitis B is common. Ask your health care provider which countries are considered high risk.  Your parents were born in a high-risk country, and you have not been immunized against hepatitis B (hepatitis B vaccine).  You have HIV or AIDS.  You use needles to inject street drugs.  You live with someone who has hepatitis B.  You have had sex with someone who has hepatitis B.  You get hemodialysis treatment.  You take certain medicines for conditions, including cancer, organ transplantation, and autoimmune conditions. Hepatitis C  Blood testing is recommended for:  Everyone born from 1945 through 1965.  Anyone with known risk factors for hepatitis C. Sexually transmitted infections (STIs)  You should be screened for sexually transmitted infections (STIs) including gonorrhea and chlamydia if:  You are sexually active and are younger than 43 years of age.  You are older than 43 years of age and your health care provider tells you that you are at risk for this type of infection.  Your sexual activity has changed since you were last screened and you are at an increased risk for chlamydia or gonorrhea. Ask your health care provider if you are at risk.  If you do not have HIV, but are at risk, it may be recommended that you take a prescription medicine daily to prevent HIV infection. This is called pre-exposure prophylaxis (PrEP). You are considered at risk if:  You are sexually active and do  not regularly use condoms or know the HIV status of your partner(s).  You take drugs by injection.  You are sexually active with a partner who has HIV. Talk with your health care provider about whether you are at high risk of being infected with HIV. If you choose to begin PrEP, you should first be tested for HIV. You should then be tested every 3 months for as long as you are taking PrEP.  PREGNANCY     If you are premenopausal and you may become pregnant, ask your health care provider about preconception counseling.  If you may become pregnant, take 400 to 800 micrograms (mcg) of folic acid every day.  If you want to prevent pregnancy, talk to your health care provider about birth control (contraception). OSTEOPOROSIS AND MENOPAUSE   Osteoporosis is a disease in which the bones lose minerals and strength with aging. This can result in serious bone fractures. Your risk for osteoporosis can be identified using a bone density scan.  If you are 65 years of age or older, or if you are at risk for osteoporosis and fractures, ask your health care provider if you should be screened.  Ask your health care provider whether you should take a calcium or vitamin D supplement to lower your risk for osteoporosis.  Menopause may have certain physical symptoms and risks.  Hormone replacement therapy may reduce some of these symptoms and risks. Talk to your health care provider about whether hormone replacement therapy is right for you.  HOME CARE INSTRUCTIONS   Schedule regular health, dental, and eye exams.  Stay current with your immunizations.   Do not use any tobacco products including cigarettes, chewing tobacco, or electronic cigarettes.  If you are pregnant, do not drink alcohol.  If you are breastfeeding, limit how much and how often you drink alcohol.  Limit alcohol intake to no more than 1 drink per day for nonpregnant women. One drink equals 12 ounces of beer, 5 ounces of wine, or 1  ounces of hard liquor.  Do not use street drugs.  Do not share needles.  Ask your health care provider for help if you need support or information about quitting drugs.  Tell your health care provider if you often feel depressed.  Tell your health care provider if you have ever been abused or do not feel safe at home.   This information is not intended to replace advice given to you by your health care provider. Make sure you discuss any questions you have with your health care provider.   Document Released: 08/19/2010 Document Revised: 02/24/2014 Document Reviewed: 01/05/2013 Elsevier Interactive Patient Education 2016 Elsevier Inc.  

## 2015-12-20 NOTE — Progress Notes (Signed)
GYN ANNUAL PREVENTATIVE CARE ENCOUNTER NOTE  Subjective:       Alyssa Melendez is a 45 y.o. G6P2002 female here for a routine annual gynecologic exam.  Current complaints: None  Cycles are regular, nonpainful. Patient is scheduled for diagnostic mammography next week due to abnormal left screening mammogram. Patient notes small nodule in the right labia majora this tends to wax and wane.   Gynecologic History Patient's last menstrual period was 11/27/2015 (exact date). Contraception: vasectomy Last Pap: 2014 normal  [facility] pending -next week   Obstetric History OB History  Gravida Para Term Preterm AB Living  2 2 2     2   SAB TAB Ectopic Multiple Live Births          2    # Outcome Date GA Lbr Len/2nd Weight Sex Delivery Anes PTL Lv  2 Term      Vag-Spont   LIV  1 Term      Vag-Spont   LIV      Past Medical History:  Diagnosis Date  . Barlow syndrome   . Complex ovarian cyst   . Congestive heart failure ([place])   . Fibroadenoma   . 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 Increased BMI   . LLQ pain   . 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 Mitral valve prolapse   . OAB (overactive bladder)     Past Surgical History:  Procedure Laterality Date  . BREAST BIOPSY Left    Negative 10+years ago  . BREAST SURGERY  2001   Benign  . CARDIAC DEFIBRILLATOR PLACEMENT  2009  . CARDIAC DEFIBRILLATOR PLACEMENT  June 2013  . CESAREAN SECTION  2007 and 2005  . MITRAL VALVE REPAIR      Current Outpatient Prescriptions on File Prior to Visit  Medication Sig Dispense Refill  . ALPRAZolam (XANAX) 0.25 MG tablet Take 0.25 mg by mouth at bedtime as needed for anxiety.    Marland Kitchen amoxicillin (AMOXIL) 500 MG capsule 500 mg. Take 4 by mouth prior to dental visit    . aspirin 81 MG tablet Take 81 mg by mouth daily.    Marland Kitchen atenolol (TENORMIN) 25 MG tablet Take 25 mg by mouth daily.    . Cholecalciferol 2000 UNITS CAPS Take 1 capsule by mouth  daily.    Marland Kitchen ibuprofen (ADVIL,MOTRIN) 800 MG tablet Take 1 tablet (800 mg total) by mouth every 8 (eight) hours as needed. 30 tablet 1  . Multiple Vitamin (MULTIVITAMIN) tablet Take 1 tablet by mouth daily.      Marland Kitchen triamcinolone lotion (KENALOG) 0.1 % Apply topically 2 (two) times daily as needed. Reported on 01/31/2015    . mometasone (NASONEX) 50 MCG/ACT nasal spray Place 2 sprays into the nose daily. 17 g 12  . nadolol (CORGARD) 40 MG tablet Take by mouth.     No current facility-administered medications on file prior to visit.     Allergies  Allergen Reactions  . Morphine And Related     GI - vomitting    Social History   Social History  . Marital status: Married    Spouse name: N/A  . Number of children: N/A  . Years of education: N/A   Occupational History  . Not on file.   Social History Main Topics  . Smoking status: Never Smoker  . Smokeless tobacco: Never Used  . Alcohol use Yes     Comment: occassionally  .  Drug use: No  . Sexual activity: Yes     Comment: vasectomy   Other Topics Concern  . Not on file   Social History Narrative  . No narrative on file    Family History  Problem Relation Age of Onset  . Hyperlipidemia Mother   . Cancer Mother 48    melanoma  . Arthritis Mother     bilateral total knee replacements  . Heart disease Father     CABG x 3   . Mental illness Father     Alzheimers Dementia  . Hyperlipidemia Father   . Stroke Father     ich  . Breast cancer Neg Hx   . Ovarian cancer Neg Hx   . Colon cancer Neg Hx     The following portions of the patient's history were reviewed and updated as appropriate: allergies, current medications, past family history, past medical history, past social history, past surgical history and problem list.  Review of Systems Review of Systems  Constitutional: Negative.   HENT: Negative.   Respiratory: Negative.   Cardiovascular: Negative.   Gastrointestinal: Negative.   Genitourinary: Negative.    Musculoskeletal: Negative.   Skin:       Right labia majora nodule  Neurological: Negative.   Psychiatric/Behavioral: Negative.      Objective:   BP 93/65   Pulse 71   Ht 5' 3"  (1.6 m)   Wt 170 lb (77.1 kg)   LMP 11/27/2015 (Exact Date)   BMI 30.11 kg/m  Physical Exam  Constitutional: She is oriented to person, place, and time. She appears well-developed and well-nourished.  HENT:  Head: Atraumatic.  Eyes: Conjunctivae and EOM are normal.  Neck: Normal range of motion. Neck supple. No thyromegaly present.  Cardiovascular: Normal rate, regular rhythm and normal heart sounds.   Pulmonary/Chest: Effort normal and breath sounds normal. Left breast exhibits no mass, no nipple discharge, no skin change and no tenderness. Breasts are symmetrical. There is no breast swelling.  Defibrillator palpable and left axilla  Abdominal: Soft. Bowel sounds are normal.  Genitourinary: No breast tenderness or discharge.  Genitourinary Comments: External genitalia-5 mm subcutaneous nodule right labia minora BUS-normal Vagina-normal Cervix-no lesions; no cervical motion tenderness Uterus-anteverted, mobile, nontender, normal size and shape Adnexa-nonpalpable and nontender Rectovaginal-normal external exam  Musculoskeletal: Normal range of motion.  Lymphadenopathy:    She has no cervical adenopathy.  Neurological: She is alert and oriented to person, place, and time.  Skin: Skin is warm and dry.  Psychiatric: She has a normal mood and affect. Her behavior is normal.    Assessment:   Annual gynecologic examination 44 y.o. Contraception: vasectomy Obesity 1 Patient Active Problem List   Diagnosis Date Noted  . AICD (automatic cardioverter/defibrillator) present 12/20/2015  . Pes anserine bursitis 06/18/2015  . Gastrocnemius muscle tear 05/30/2015  . Posterior left knee pain 05/14/2015  . Vitamin D deficiency 11/29/2014  . Hyperlipidemia 04/11/2014  . Overweight 04/11/2014  . Carpal  tunnel syndrome 11/02/2013  . Breast calcifications on mammogram 09/21/2013  . Routine general medical examination at a health care facility 02/22/2013  . Plantar fasciitis, bilateral 02/21/2013  . Cardiac arrest ([place]) 11/22/2012  . Hirsutism 02/16/2012  . Shoulder pain, left 09/28/2011  . Hx-sudden cardiac arrest   . Long Q-T syndrome   . Mitral valve prolapse      Plan:  Pap: Pap Co Test Mammogram: Pending Labs: PCP to perform Routine preventative health maintenance measures emphasized: Diet/Weight control, Tobacco Cessation and Alcohol/Drug use  Contraception-vasectomy Return to [place], MD  Brayton Mars, MD  Note: This dictation was prepared with Dragon dictation along with smaller phrase technology. Any transcriptional errors that result from this process are unintentional.

## 2015-12-25 LAB — PAP IG AND HPV HIGH-RISK
HPV, HIGH-RISK: NEGATIVE
PAP Smear Comment: 0

## 2015-12-28 ENCOUNTER — Ambulatory Visit
Admission: RE | Admit: 2015-12-28 | Discharge: 2015-12-28 | Disposition: A | Discharge status: Home / Self Care | Payer: 59 | Source: Ambulatory Visit | Attending: Internal Medicine | Admitting: Internal Medicine

## 2015-12-28 DIAGNOSIS — N6320 Unspecified lump in the left breast, unspecified quadrant: Secondary | ICD-10-CM

## 2016-01-17 ENCOUNTER — Encounter: Payer: BLUE CROSS/BLUE SHIELD | Admitting: Obstetrics and Gynecology

## 2016-01-18 ENCOUNTER — Encounter: Payer: Self-pay | Admitting: Family Medicine

## 2016-01-18 ENCOUNTER — Ambulatory Visit (INDEPENDENT_AMBULATORY_CARE_PROVIDER_SITE_OTHER): Payer: 59 | Admitting: Family Medicine

## 2016-01-18 DIAGNOSIS — B029 Zoster without complications: Secondary | ICD-10-CM | POA: Diagnosis not present

## 2016-01-18 MED ORDER — VALACYCLOVIR HCL 1 G PO TABS: 1000.0000 mg | ORAL_TABLET | Freq: Three times a day (TID) | ORAL | 0 refills | Status: DC

## 2016-01-18 NOTE — Progress Notes (Signed)
Subjective:  Patient ID: Alyssa Melendez, female    DOB: 03-13-1970  Age: 45 y.o. MRN: [id number]  CC: Rash, concern for shingles  HPI:  45 year old female with a complicated past medical history including long QT and history of cardiac arrest presents with the above complaint.  Patient states that yesterday she developed a severe burning sensation on the right side of her mid thoracic spine radiating around. She has now developed a small area of rash. She continues to have the burning sensation. She's used over-the-counter NSAIDs with some improvement. No known exacerbating factors. She is concerned that this may be an early presentation of shingles. No other associated symptoms. No other complaints at this time. No reports of any new exposures or medication changes.  Social Hx  Social History   Social History  . Marital status: Married    Spouse name: N/A  . Number of children: N/A  . Years of education: N/A   Social History Main Topics  . Smoking status: Never Smoker  . Smokeless tobacco: Never Used  . Alcohol use Yes     Comment: occassionally  . Drug use: No  . Sexual activity: Yes     Comment: vasectomy   Other Topics Concern  . None   Social History Narrative  . None    Review of Systems  Constitutional: Negative.   Skin: Positive for rash.   Objective:  BP 95/61 (BP Location: Left Arm, Patient Position: Sitting, Cuff Size: Normal)   Pulse 71   Temp 97.4 F (36.3 C) (Oral)   Resp 12   Wt 172 lb 8 oz (78.2 kg)   LMP 12/21/2015   SpO2 100%   BMI 30.56 kg/m   BP/Weight 01/18/2016 12/20/2015 9/44/9675  Systolic BP 95 93 916  Diastolic BP 61 65 66  Wt. (Lbs) 172.5 170 166.5  BMI 30.56 30.11 29.49    Physical Exam  Constitutional: She is oriented to person, place, and time. She appears well-developed. No distress.  Pulmonary/Chest: Effort normal.  Neurological: She is alert and oriented to person, place, and time.  Skin:  Thoracic spine/flank - patient  has a small area of erythematous vesicular rash on the trunk/flank.   Psychiatric: She has a normal mood and affect.  Vitals reviewed.  Lab Results  Component Value Date   WBC 4.9 11/14/2015   HGB 13.5 11/14/2015   HCT 40.3 11/14/2015   PLT 253.0 11/14/2015   GLUCOSE 85 11/14/2015   CHOL 215 (H) 11/14/2015   TRIG 81.0 11/14/2015   HDL 51.70 11/14/2015   LDLDIRECT 147.3 02/21/2013   LDLCALC 147 (H) 11/14/2015   ALT 13 11/14/2015   AST 18 11/14/2015   NA 138 11/14/2015   K 4.1 11/14/2015   CL 103 11/14/2015   CREATININE 0.87 11/14/2015   BUN 15 11/14/2015   CO2 31 11/14/2015   TSH 1.26 11/14/2015   Assessment & Plan:   Problem List Items Addressed This Visit    Herpes zoster    New problem. Suspect shingles based on appearance and presentation. Treating empirically with Valtrex. Over-the-counter analgesics.      Relevant Medications   valACYclovir (VALTREX) 1000 MG tablet     Meds ordered this encounter  Medications  . valACYclovir (VALTREX) 1000 MG tablet    Sig: Take 1 tablet (1,000 mg total) by mouth 3 (three) times daily.    Dispense:  21 tablet    Refill:  0   Follow-up: PRN  [facility]  [employer]

## 2016-01-18 NOTE — Patient Instructions (Signed)
Take the medication as prescribed.  Wash hands well.  You should be fine to go back to work.  Take care  Dr. Lacinda Axon

## 2016-01-18 NOTE — Progress Notes (Signed)
Pre visit review using our clinic review tool, if applicable. No additional management support is needed unless otherwise documented below in the visit note. 

## 2016-01-18 NOTE — Assessment & Plan Note (Signed)
New problem. Suspect shingles based on appearance and presentation. Treating empirically with Valtrex. Over-the-counter analgesics.

## 2016-03-19 ENCOUNTER — Encounter: Payer: Self-pay | Admitting: Physician Assistant

## 2016-03-19 ENCOUNTER — Ambulatory Visit: Payer: Self-pay | Admitting: Physician Assistant

## 2016-03-19 VITALS — BP 98/70 | HR 71 | Temp 98.6°F

## 2016-03-19 DIAGNOSIS — R509 Fever, unspecified: Secondary | ICD-10-CM

## 2016-03-19 LAB — POCT INFLUENZA A/B
INFLUENZA A, POC: NEGATIVE
Influenza B, POC: NEGATIVE

## 2016-03-19 MED ORDER — OSELTAMIVIR PHOSPHATE 75 MG PO CAPS
75.0000 mg | ORAL_CAPSULE | Freq: Two times a day (BID) | ORAL | 0 refills | Status: DC
Start: 2016-03-19 — End: 2017-08-27

## 2016-03-19 NOTE — Progress Notes (Signed)
S: C/o runny nose and congestion with dry cough for  day, + fever, chills, sweating, took 3 showers because of the sweating; denies cp/sob, v/d; mucus was green this am but clear throughout the day, cough is sporadic, aches all over  Using otc meds: tylenol/motrin  O: PE: vitals wnl, nad,  Pt feels hot to touch, perrl eomi, normocephalic, tms dull, nasal mucosa red and swollen, throat injected, neck supple no lymph, lungs c t a, cv rrr, neuro intact, flu swab   A:  Acute flu like illness   P: drink fluids, continue regular meds , use otc meds of choice, return if not improving in 5 days, return earlier if worsening tamiflu 75 mg bid, if cough worsens with green mucus throughout the whole day then will call in an antibiotic, reassured pt that her sx are like the flu

## 2016-05-02 DIAGNOSIS — Z95810 Presence of automatic (implantable) cardiac defibrillator: Secondary | ICD-10-CM | POA: Diagnosis not present

## 2016-05-02 DIAGNOSIS — I4581 Long QT syndrome: Secondary | ICD-10-CM | POA: Diagnosis not present

## 2016-05-27 ENCOUNTER — Encounter: Payer: Self-pay | Admitting: Internal Medicine

## 2016-05-27 DIAGNOSIS — N6320 Unspecified lump in the left breast, unspecified quadrant: Secondary | ICD-10-CM

## 2016-06-30 ENCOUNTER — Ambulatory Visit
Admission: RE | Admit: 2016-06-30 | Discharge: 2016-06-30 | Disposition: A | Discharge status: Home / Self Care | Payer: 59 | Source: Ambulatory Visit | Attending: Internal Medicine | Admitting: Internal Medicine

## 2016-06-30 DIAGNOSIS — N6320 Unspecified lump in the left breast, unspecified quadrant: Secondary | ICD-10-CM | POA: Insufficient documentation

## 2016-06-30 DIAGNOSIS — N6321 Unspecified lump in the left breast, upper outer quadrant: Secondary | ICD-10-CM | POA: Diagnosis not present

## 2016-07-30 DIAGNOSIS — Z95810 Presence of automatic (implantable) cardiac defibrillator: Secondary | ICD-10-CM | POA: Diagnosis not present

## 2016-07-30 DIAGNOSIS — I469 Cardiac arrest, cause unspecified: Secondary | ICD-10-CM | POA: Diagnosis not present

## 2016-07-30 DIAGNOSIS — I4581 Long QT syndrome: Secondary | ICD-10-CM | POA: Diagnosis not present

## 2016-11-13 NOTE — Telephone Encounter (Signed)
Error

## 2016-11-19 ENCOUNTER — Encounter: Payer: Self-pay | Admitting: Internal Medicine

## 2016-11-19 DIAGNOSIS — R928 Other abnormal and inconclusive findings on diagnostic imaging of breast: Secondary | ICD-10-CM

## 2016-11-28 ENCOUNTER — Telehealth: Payer: Self-pay

## 2016-11-28 DIAGNOSIS — Z1239 Encounter for other screening for malignant neoplasm of breast: Secondary | ICD-10-CM

## 2016-11-28 DIAGNOSIS — R928 Other abnormal and inconclusive findings on diagnostic imaging of breast: Secondary | ICD-10-CM

## 2016-11-28 NOTE — Telephone Encounter (Signed)
Changed.

## 2016-11-28 NOTE — Telephone Encounter (Signed)
-----   Message from Eustace Pen sent at 11/26/2016  9:33 AM EDT ----- Regarding: Drucilla Chalet, Can you please change this patients mammogram order to [id number]? It needs to be TOMO. Thank you! Melissa

## 2016-12-04 NOTE — Telephone Encounter (Signed)
Sorry! Evidently the ultrasound order is wrong as well. Can you change that one to [id number] please?? Thank you! Melissa

## 2016-12-08 NOTE — Addendum Note (Signed)
Addended by: Adair Laundry on: 12/08/2016 10:10 AM   Modules accepted: Orders

## 2016-12-08 NOTE — Telephone Encounter (Signed)
Changed the order to [id number].

## 2016-12-17 ENCOUNTER — Other Ambulatory Visit: Payer: Self-pay

## 2017-01-15 ENCOUNTER — Ambulatory Visit
Admission: RE | Admit: 2017-01-15 | Discharge: 2017-01-15 | Disposition: A | Discharge status: Home / Self Care | Payer: 59 | Source: Ambulatory Visit | Attending: Internal Medicine | Admitting: Internal Medicine

## 2017-01-15 ENCOUNTER — Other Ambulatory Visit: Payer: Self-pay | Admitting: Internal Medicine

## 2017-01-15 DIAGNOSIS — R922 Inconclusive mammogram: Secondary | ICD-10-CM | POA: Diagnosis not present

## 2017-01-15 DIAGNOSIS — R928 Other abnormal and inconclusive findings on diagnostic imaging of breast: Secondary | ICD-10-CM

## 2017-01-15 DIAGNOSIS — N6002 Solitary cyst of left breast: Secondary | ICD-10-CM | POA: Diagnosis not present

## 2017-01-15 DIAGNOSIS — Z1239 Encounter for other screening for malignant neoplasm of breast: Secondary | ICD-10-CM

## 2017-01-15 DIAGNOSIS — N6321 Unspecified lump in the left breast, upper outer quadrant: Secondary | ICD-10-CM | POA: Diagnosis not present

## 2017-02-04 DIAGNOSIS — I4581 Long QT syndrome: Secondary | ICD-10-CM | POA: Diagnosis not present

## 2017-02-04 DIAGNOSIS — Z95810 Presence of automatic (implantable) cardiac defibrillator: Secondary | ICD-10-CM | POA: Diagnosis not present

## 2017-02-04 DIAGNOSIS — I469 Cardiac arrest, cause unspecified: Secondary | ICD-10-CM | POA: Diagnosis not present

## 2017-07-01 ENCOUNTER — Other Ambulatory Visit: Payer: Self-pay | Admitting: Obstetrics and Gynecology

## 2017-07-01 ENCOUNTER — Encounter: Payer: Self-pay | Admitting: Obstetrics and Gynecology

## 2017-07-01 ENCOUNTER — Ambulatory Visit: Payer: 59 | Admitting: Obstetrics and Gynecology

## 2017-07-01 ENCOUNTER — Other Ambulatory Visit (INDEPENDENT_AMBULATORY_CARE_PROVIDER_SITE_OTHER): Payer: 59

## 2017-07-01 VITALS — BP 100/72 | HR 71 | Ht 63.0 in | Wt 175.7 lb

## 2017-07-01 DIAGNOSIS — N938 Other specified abnormal uterine and vaginal bleeding: Secondary | ICD-10-CM | POA: Diagnosis not present

## 2017-07-01 DIAGNOSIS — N926 Irregular menstruation, unspecified: Secondary | ICD-10-CM

## 2017-07-01 DIAGNOSIS — E559 Vitamin D deficiency, unspecified: Secondary | ICD-10-CM

## 2017-07-01 NOTE — Patient Instructions (Signed)
Uterine Fibroids Uterine fibroids are tissue masses (tumors) that can develop in the womb (uterus). They are also called leiomyomas. This type of tumor is not cancerous (benign) and does not spread to other parts of the body outside of the pelvic area, which is between the hip bones. Occasionally, fibroids may develop in the fallopian tubes, in the cervix, or on the support structures (ligaments) that surround the uterus. You can have one or many fibroids. Fibroids can vary in size, weight, and where they grow in the uterus. Some can become quite large. Most fibroids do not require medical treatment. What are the causes? A fibroid can develop when a single uterine cell keeps growing (replicating). Most cells in the human body have a control mechanism that keeps them from replicating without control. What are the signs or symptoms? Symptoms may include:  Heavy bleeding during your period.  Bleeding or spotting between periods.  Pelvic pain and pressure.  Bladder problems, such as needing to urinate more often (urinary frequency) or urgently.  Inability to reproduce offspring (infertility).  Miscarriages.  How is this diagnosed? Uterine fibroids are diagnosed through a physical exam. Your health care provider may feel the lumpy tumors during a pelvic exam. Ultrasonography and an MRI may be done to determine the size, location, and number of fibroids. How is this treated? Treatment may include:  Watchful waiting. This involves getting the fibroid checked by your health care provider to see if it grows or shrinks. Follow your health care provider's recommendations for how often to have this checked.  Hormone medicines. These can be taken by mouth or given through an intrauterine device (IUD).  Surgery. ? Removing the fibroids (myomectomy) or the uterus (hysterectomy). ? Removing blood supply to the fibroids (uterine artery embolization).  If fibroids interfere with your fertility and you  want to become pregnant, your health care provider may recommend having the fibroids removed. Follow these instructions at home:  Keep all follow-up visits as directed by your health care provider. This is important.  Take over-the-counter and prescription medicines only as told by your health care provider. ? If you were prescribed a hormone treatment, take the hormone medicines exactly as directed.  Ask your health care provider about taking iron pills and increasing the amount of dark green, leafy vegetables in your diet. These actions can help to boost your blood iron levels, which may be affected by heavy menstrual bleeding.  Pay close attention to your period and tell your health care provider about any changes, such as: ? Increased blood flow that requires you to use more pads or tampons than usual per month. ? A change in the number of days that your period lasts per month. ? A change in symptoms that are associated with your period, such as abdominal cramping or back pain. Contact a health care provider if:  You have pelvic pain, back pain, or abdominal cramps that cannot be controlled with medicines.  You have an increase in bleeding between and during periods.  You soak tampons or pads in a half hour or less.  You feel lightheaded, extra tired, or weak. Get help right away if:  You faint.  You have a sudden increase in pelvic pain. This information is not intended to replace advice given to you by your health care provider. Make sure you discuss any questions you have with your health care provider. Document Released: 02/01/2000 Document Revised: 10/04/2015 Document Reviewed: 08/02/2013 Elsevier Interactive Patient Education  [employer].

## 2017-07-01 NOTE — Progress Notes (Signed)
  Subjective:     Patient ID: Alyssa Melendez, female   DOB: 1970-04-03, 47 y.o.   MRN: [id number]  HPI Reports prolonged brown spotting after menses for last two cycles.menses started on time and normal bleeding for 5-6 days that turns into dark brown spotting for 10 days. Denies urinary or bowel changes, dysparenia or postcoital spotting. No changes in medications, sleep or weight. Hasn't had any lbs in a year and a half.  Review of Systems  Constitutional: Negative.   HENT: Negative.   Eyes: Negative.   Respiratory: Negative.   Cardiovascular: Negative.   Gastrointestinal: Negative.   Endocrine: Negative.   Genitourinary: Positive for menstrual problem and vaginal bleeding.  Allergic/Immunologic: Negative.   Neurological: Negative.   Hematological: Negative.   Psychiatric/Behavioral: Negative.        Objective:   Physical Exam A&Ox4 Well groomed female in no distress Blood pressure 100/72, pulse 71, height 5' 3"  (1.6 m), weight 175 lb 11.2 oz (79.7 kg), last menstrual period 06/15/2017. Thyroid not enlarged. Skin warm and dry with pink mucus membranes Pelvic exam: normal external genitalia, vulva, vagina, cervix, uterus and adnexa.scant light brown cervical discharge noted. Pelvic ultrasound today reveals the following:   Findings:  The uterus measures 10.8 x 5.1 x 4.1 cm. Echo texture is homogeneous with evidence of focal masses. Within the uterus is a suspected fibroid measuring: Fibroid 1: Anterior, Subserosal, 1.7 x 1.9 x 1.7 cm The Endometrium measures 10.4 mm. Fluid is noted in the cervical canal.  Right Ovary measures 2.8 x 2.2 x 2.1 cm. It is normal in appearance. Left Ovary measures 2.7 x 2.3 x 1.3 cm. It is normal appearance. Survey of the adnexa demonstrates no adnexal masses. There is no free fluid in the cul de sac.  Impression: 1. Anteverted fibroid uterus 2. Anterior, subserosal fibroid measuring 1.7 x 1.9 x 1.7 cm 3. Fluid noted in the cervical  canal.     Assessment:     DUB Uterine fibroid    Plan:     Labs obtained-will follow up accordingly Discussed common causes of irregular menstrual bleeding including thyroid dysfunction, vitamin deficiencies and anemia, peri-menopause, & infection. Counseled on uterine fibroid and possible future treatment. Will follow up in 1 month at annual with Dr Burney Gauze. Information in AVS given to review,  Anwar Crill,CNM

## 2017-07-02 LAB — CBC
Hematocrit: 39.3 % (ref 34.0–46.6)
Hemoglobin: 13.2 g/dL (ref 11.1–15.9)
MCH: 30.8 pg (ref 26.6–33.0)
MCHC: 33.6 g/dL (ref 31.5–35.7)
MCV: 92 fL (ref 79–97)
PLATELETS: 284 10*3/uL (ref 150–379)
RBC: 4.28 x10E6/uL (ref 3.77–5.28)
RDW: 14 % (ref 12.3–15.4)
WBC: 5.1 10*3/uL (ref 3.4–10.8)

## 2017-07-02 LAB — COMPREHENSIVE METABOLIC PANEL
ALBUMIN: 4.3 g/dL (ref 3.5–5.5)
ALT: 15 IU/L (ref 0–32)
AST: 21 IU/L (ref 0–40)
Albumin/Globulin Ratio: 2 (ref 1.2–2.2)
Alkaline Phosphatase: 62 IU/L (ref 39–117)
BUN / CREAT RATIO: 9 (ref 9–23)
BUN: 8 mg/dL (ref 6–24)
Bilirubin Total: 0.3 mg/dL (ref 0.0–1.2)
CO2: 23 mmol/L (ref 20–29)
CREATININE: 0.89 mg/dL (ref 0.57–1.00)
Calcium: 9.2 mg/dL (ref 8.7–10.2)
Chloride: 103 mmol/L (ref 96–106)
GFR calc Af Amer: 91 mL/min/{1.73_m2} (ref >59)
GFR calc non Af Amer: 79 mL/min/{1.73_m2} (ref >59)
Globulin, Total: 2.2 g/dL (ref 1.5–4.5)
Glucose: 81 mg/dL (ref 65–99)
Potassium: 4.2 mmol/L (ref 3.5–5.2)
Sodium: 139 mmol/L (ref 134–144)
TOTAL PROTEIN: 6.5 g/dL (ref 6.0–8.5)

## 2017-07-02 LAB — THYROID PANEL WITH TSH
Free Thyroxine Index: 1.4 (ref 1.2–4.9)
T3 Uptake Ratio: 23 % — ABNORMAL LOW (ref 24–39)
T4 TOTAL: 6.3 ug/dL (ref 4.5–12.0)
TSH: 1.57 u[IU]/mL (ref 0.450–4.500)

## 2017-07-02 LAB — B12 AND FOLATE PANEL
Folate: >20 ng/mL (ref >3.0)
Vitamin B-12: 519 pg/mL (ref 232–1245)

## 2017-07-02 LAB — VITAMIN D 25 HYDROXY (VIT D DEFICIENCY, FRACTURES): VIT D 25 HYDROXY: 42.6 ng/mL (ref 30.0–100.0)

## 2017-07-02 LAB — FSH/LH
FSH: 4.8 m[IU]/mL
LH: 5.5 m[IU]/mL

## 2017-07-02 LAB — FERRITIN: FERRITIN: 29 ng/mL (ref 15–150)

## 2017-07-02 LAB — PROTIME-INR
INR: 1 (ref 0.8–1.2)
PROTHROMBIN TIME: 10.8 s (ref 9.1–12.0)

## 2017-08-24 DIAGNOSIS — Z9889 Other specified postprocedural states: Secondary | ICD-10-CM | POA: Diagnosis not present

## 2017-08-24 DIAGNOSIS — I469 Cardiac arrest, cause unspecified: Secondary | ICD-10-CM | POA: Diagnosis not present

## 2017-08-24 DIAGNOSIS — I4581 Long QT syndrome: Secondary | ICD-10-CM | POA: Diagnosis not present

## 2017-08-24 NOTE — Progress Notes (Signed)
GYN ANNUAL PREVENTATIVE CARE ENCOUNTER NOTE  Subjective:       Alyssa Melendez is a 47 y.o. G36P2002 female here for a routine annual gynecologic exam.  Current complaints: None  1. BTB- see u/s  Patient is experiencing normal flow for 5 to 6 days, followed by post menstrual spotting for another 7 days.  She is not experiencing any significant pelvic pain with the bleeding.  Bowel function and bladder function are normal.  No major new interval health issues have been identified.    Gynecologic History lmp- 08/27/2017  Contraception: vasectomy Last Pap: 12/20/2015 neg/neg  normal  Last Mammogram-12/2016 birad 3-   Obstetric History OB History  Gravida Para Term Preterm AB Living  2 2 2     2   SAB TAB Ectopic Multiple Live Births          2    # Outcome Date GA Lbr Len/2nd Weight Sex Delivery Anes PTL Lv  2 Term      Vag-Spont   LIV  1 Term      Vag-Spont   LIV    Past Medical History:  Diagnosis Date  . Barlow syndrome   . Complex ovarian cyst   . Congestive heart failure ([place])   . Fibroadenoma   . 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 Increased BMI   . LLQ pain   . 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 Mitral valve prolapse   . OAB (overactive bladder)     Past Surgical History:  Procedure Laterality Date  . BREAST BIOPSY Left    Negative 10+years ago  . BREAST SURGERY  2001   Benign  . CARDIAC DEFIBRILLATOR PLACEMENT  2009  . CARDIAC DEFIBRILLATOR PLACEMENT  June 2013  . CESAREAN SECTION  2007 and 2005  . MITRAL VALVE REPAIR      Current Outpatient Medications on File Prior to Visit  Medication Sig Dispense Refill  . ALPRAZolam (XANAX) 0.25 MG tablet Take 0.25 mg by mouth at bedtime as needed for anxiety.    Marland Kitchen aspirin 81 MG tablet Take 81 mg by mouth daily.    Marland Kitchen atenolol (TENORMIN) 25 MG tablet Take 25 mg by mouth daily.    . Cholecalciferol 2000 UNITS CAPS Take 1 capsule by mouth daily.    Marland Kitchen ibuprofen  (ADVIL,MOTRIN) 800 MG tablet Take 1 tablet (800 mg total) by mouth every 8 (eight) hours as needed. (Patient not taking: Reported on 07/01/2017) 30 tablet 1  . mometasone (NASONEX) 50 MCG/ACT nasal spray Place 2 sprays into the nose daily. 17 g 12  . Multiple Vitamin (MULTIVITAMIN) tablet Take 1 tablet by mouth daily.      Marland Kitchen oseltamivir (TAMIFLU) 75 MG capsule Take 1 capsule (75 mg total) by mouth 2 (two) times daily. (Patient not taking: Reported on 07/01/2017) 10 capsule 0   No current facility-administered medications on file prior to visit.     Allergies  Allergen Reactions  . Morphine And Related     GI - vomitting    Social History   Socioeconomic History  . Marital status: Married    Spouse name: Not on file  . Number of children: Not on file  . Years of education: Not on file  . Highest education level: Not on file  Occupational History  . Not on file  Social Needs  . Financial resource strain: Not on file  . Food insecurity:  Worry: Not on file    Inability: Not on file  . Transportation needs:    Medical: Not on file    Non-medical: Not on file  Tobacco Use  . Smoking status: Never Smoker  . Smokeless tobacco: Never Used  Substance and Sexual Activity  . Alcohol use: Yes    Comment: occassionally  . Drug use: No  . Sexual activity: Yes    Comment: vasectomy  Lifestyle  . Physical activity:    Days per week: Not on file    Minutes per session: Not on file  . Stress: Not on file  Relationships  . Social connections:    Talks on phone: Not on file    Gets together: Not on file    Attends religious service: Not on file    Active member of club or organization: Not on file    Attends meetings of clubs or organizations: Not on file    Relationship status: Not on file  . Intimate partner violence:    Fear of current or ex partner: Not on file    Emotionally abused: Not on file    Physically abused: Not on file    Forced sexual activity: Not on file  Other  Topics Concern  . Not on file  Social History Narrative  . Not on file    Family History  Problem Relation Age of Onset  . Hyperlipidemia Mother   . Cancer Mother 39       melanoma  . Arthritis Mother        bilateral total knee replacements  . Heart disease Father        CABG x 3   . Mental illness Father        Alzheimers Dementia  . Hyperlipidemia Father   . Stroke Father        ich  . Breast cancer Neg Hx   . Ovarian cancer Neg Hx   . Colon cancer Neg Hx     The following portions of the patient's history were reviewed and updated as appropriate: allergies, current medications, past family history, past medical history, past social history, past surgical history and problem list.  Review of Systems Review of Systems  Constitutional: Negative.   HENT: Negative.   Eyes: Negative.   Respiratory: Negative.   Gastrointestinal: Negative.   Genitourinary: Negative.        Prolonged menses  Musculoskeletal: Negative.   Skin: Negative.   Neurological: Negative.   Endo/Heme/Allergies: Negative.   Psychiatric/Behavioral: Negative.       Objective:  BP 103/69   Pulse 71   Ht 5' 3"  (1.6 m)   Wt 178 lb (80.7 kg)   LMP 08/27/2017 (Exact Date)   BMI 31.53 kg/m   Physical Exam  Constitutional: She is oriented to person, place, and time. She appears well-developed and well-nourished.  HENT:  Head: Normocephalic and atraumatic.  Eyes: Conjunctivae and EOM are normal.  Neck: Normal range of motion. Neck supple. No thyromegaly present.  Cardiovascular: Normal rate, regular rhythm and normal heart sounds.  Pulmonary/Chest: Effort normal and breath sounds normal. Left breast exhibits no mass, no nipple discharge, no skin change and no tenderness. No breast swelling, tenderness or discharge. Breasts are symmetrical.  Defibrillator palpable and left axilla  Abdominal: Soft. She exhibits no distension and no mass. There is no tenderness.  Genitourinary: No breast swelling,  tenderness or discharge.  Genitourinary Comments: External genitalia-normal BUS-normal Vagina-normal; menstrual blood in vaginal vault Cervix-no lesions; no  cervical motion tenderness; cervix deviated posteriorly and to the right; nulliparous Uterus-anteverted, mobile, nontender, normal size and shape Adnexa-nonpalpable and nontender Rectovaginal-normal external exam  Musculoskeletal: Normal range of motion.  Lymphadenopathy:    She has no cervical adenopathy.  Neurological: She is alert and oriented to person, place, and time.  Skin: Skin is warm and dry.  Psychiatric: She has a normal mood and affect. Her behavior is normal.   PROCEDURE:Endometrial Biopsy Procedure Note  Pre-operative Diagnosis: Menorrhagia  Post-operative Diagnosis: same  Indications: abnormal uterine bleeding  Procedure Details   Urine pregnancy test was not done.  The risks (including infection, bleeding, pain, and uterine perforation) and benefits of the procedure were explained to the patient and Verbal informed consent was obtained.  Antibiotic prophylaxis against endocarditis was not indicated.   The patient was placed in the dorsal lithotomy position.  Bimanual exam showed the uterus to be in the anteroflexed position.  A Peterson speculum inserted in the vagina..  Endocervical curettage with a Kevorkian curette was not performed.  Cervical stenosis was encountered.   A sharp tenaculum was applied to the anterior, right lip of the cervix for stabilization.  Lacrimal duct probes were used to dilate the endocervical canal.  Milex pipette was used to sound the uterine cavity to 8 cm.  A Mylex 54m curette was used to sample the endometrium.  Sample was sent for pathologic examination.  Condition: Stable  Complications: None  Plan:  The patient was advised to call for any fever or for prolonged or severe pain or bleeding. She was advised to use OTC acetaminophen and OTC ibuprofen as needed for mild to  moderate pain. She was advised to avoid vaginal intercourse for 48 hours or until the bleeding has completely stopped.  Attending Physician Documentation: MBrayton Mars MD  Assessment:   Annual gynecologic examination 47y.o. Contraception: vasectomy Obesity 1 Patient Active Problem List   Diagnosis Date Noted  . Herpes zoster 01/18/2016  . AICD (automatic cardioverter/defibrillator) present 12/20/2015  . Pes anserine bursitis 06/18/2015  . Gastrocnemius muscle tear 05/30/2015  . Posterior left knee pain 05/14/2015  . Vitamin D deficiency 11/29/2014  . Hyperlipidemia 04/11/2014  . Overweight 04/11/2014  . Carpal tunnel syndrome 11/02/2013  . Breast calcifications on mammogram 09/21/2013  . Routine general medical examination at a health care facility 02/22/2013  . Plantar fasciitis, bilateral 02/21/2013  . [place]  . Hirsutism 02/16/2012  . Hx-sudden cardiac arrest   . Long Q-T syndrome   . Mitral valve prolapse   Menorrhagia  Plan:  Pap: Due 2020 Mammogram: ordered-thru pcp.  Mammogram is needed in November 2019. Labs: PCP to perform Routine preventative health maintenance measures emphasized: Diet/Weight control, Tobacco Cessation and Alcohol/Drug use  Contraception-vasectomy Endometrial biopsy is done today.  Endocervical canal dilation also. Results from endometrial biopsy will be made available through my chart Return to [place] MBrayton Mars MD   Note: This dictation was prepared with Dragon dictation along with smaller phrase technology. Any transcriptional errors that result from this process are unintentional.

## 2017-08-27 ENCOUNTER — Ambulatory Visit (INDEPENDENT_AMBULATORY_CARE_PROVIDER_SITE_OTHER): Payer: 59 | Admitting: Obstetrics and Gynecology

## 2017-08-27 ENCOUNTER — Encounter: Payer: Self-pay | Admitting: Obstetrics and Gynecology

## 2017-08-27 ENCOUNTER — Other Ambulatory Visit (HOSPITAL_COMMUNITY)
Admission: RE | Admit: 2017-08-27 | Discharge: 2017-08-27 | Disposition: A | Discharge status: Home / Self Care | Payer: 59 | Source: Ambulatory Visit | Attending: Obstetrics and Gynecology | Admitting: Obstetrics and Gynecology

## 2017-08-27 VITALS — BP 103/69 | HR 71 | Ht 63.0 in | Wt 178.0 lb

## 2017-08-27 DIAGNOSIS — Z01411 Encounter for gynecological examination (general) (routine) with abnormal findings: Secondary | ICD-10-CM | POA: Diagnosis not present

## 2017-08-27 DIAGNOSIS — E669 Obesity, unspecified: Secondary | ICD-10-CM

## 2017-08-27 DIAGNOSIS — Z309 Encounter for contraceptive management, unspecified: Secondary | ICD-10-CM | POA: Diagnosis not present

## 2017-08-27 DIAGNOSIS — N938 Other specified abnormal uterine and vaginal bleeding: Secondary | ICD-10-CM

## 2017-08-27 DIAGNOSIS — N858 Other specified noninflammatory disorders of uterus: Secondary | ICD-10-CM | POA: Diagnosis not present

## 2017-08-27 DIAGNOSIS — Z6831 Body mass index (BMI) 31.0-31.9, adult: Secondary | ICD-10-CM

## 2017-08-27 DIAGNOSIS — Z124 Encounter for screening for malignant neoplasm of cervix: Secondary | ICD-10-CM

## 2017-08-27 DIAGNOSIS — Z01419 Encounter for gynecological examination (general) (routine) without abnormal findings: Secondary | ICD-10-CM

## 2017-08-27 NOTE — Patient Instructions (Addendum)
1.  No Pap smear is done.  Next Pap smear is due in 2020 2.  Mammogram follow-up is to be done through primary care.  Mammogram is due in November 2019. 3.  Screening labs are to be done through primary care 4.  Continue with healthy eating and exercise with controlled weight loss 5.  Endometrial biopsy is done today because of menorrhagia 6.  Results from biopsy will be made available through my chart 7.  Return in 1 year for annual exam  Health Maintenance, Female Adopting a healthy lifestyle and getting preventive care can go a long way to promote health and wellness. Talk with your health care provider about what schedule of regular examinations is right for you. This is a good chance for you to check in with your provider about disease prevention and staying healthy. In between checkups, there are plenty of things you can do on your own. Experts have done a lot of research about which lifestyle changes and preventive measures are most likely to keep you healthy. Ask your health care provider for more information. Weight and diet Eat a healthy diet  Be sure to include plenty of vegetables, fruits, low-fat dairy products, and lean protein.  Do not eat a lot of foods high in solid fats, added sugars, or salt.  Get regular exercise. This is one of the most important things you can do for your health. ? Most adults should exercise for at least 150 minutes each week. The exercise should increase your heart rate and make you sweat (moderate-intensity exercise). ? Most adults should also do strengthening exercises at least twice a week. This is in addition to the moderate-intensity exercise.  Maintain a healthy weight  Body mass index (BMI) is a measurement that can be used to identify possible weight problems. It estimates body fat based on height and weight. Your health care provider can help determine your BMI and help you achieve or maintain a healthy weight.  For females 90 years of age  and older: ? A BMI below 18.5 is considered underweight. ? A BMI of 18.5 to 24.9 is normal. ? A BMI of 25 to 29.9 is considered overweight. ? A BMI of 30 and above is considered obese.  Watch levels of cholesterol and blood lipids  You should start having your blood tested for lipids and cholesterol at 45 years of age, then have this test every 5 years.  You may need to have your cholesterol levels checked more often if: ? Your lipid or cholesterol levels are high. ? You are older than 45 years of age. ? You are at high risk for heart disease.  Cancer screening Lung Cancer  Lung cancer screening is recommended for adults 83-10 years old who are at high risk for lung cancer because of a history of smoking.  A yearly low-dose CT scan of the lungs is recommended for people who: ? Currently smoke. ? Have quit within the past 15 years. ? Have at least a 30-pack-year history of smoking. A pack year is smoking an average of one pack of cigarettes a day for 1 year.  Yearly screening should continue until it has been 15 years since you quit.  Yearly screening should stop if you develop a health problem that would prevent you from having lung cancer treatment.  Breast Cancer  Practice breast self-awareness. This means understanding how your breasts normally appear and feel.  It also means doing regular breast self-exams. Let your health care  provider know about any changes, no matter how small.  If you are in your 20s or 30s, you should have a clinical breast exam (CBE) by a health care provider every 1-3 years as part of a regular health exam.  If you are 40 or older, have a CBE every year. Also consider having a breast X-ray (mammogram) every year.  If you have a family history of breast cancer, talk to your health care provider about genetic screening.  If you are at high risk for breast cancer, talk to your health care provider about having an MRI and a mammogram every  year.  Breast cancer gene (BRCA) assessment is recommended for women who have family members with BRCA-related cancers. BRCA-related cancers include: ? Breast. ? Ovarian. ? Tubal. ? Peritoneal cancers.  Results of the assessment will determine the need for genetic counseling and BRCA1 and BRCA2 testing.  Cervical Cancer Your health care provider may recommend that you be screened regularly for cancer of the pelvic organs (ovaries, uterus, and vagina). This screening involves a pelvic examination, including checking for microscopic changes to the surface of your cervix (Pap test). You may be encouraged to have this screening done every 3 years, beginning at age 21.  For women ages 30-65, health care providers may recommend pelvic exams and Pap testing every 3 years, or they may recommend the Pap and pelvic exam, combined with testing for human papilloma virus (HPV), every 5 years. Some types of HPV increase your risk of cervical cancer. Testing for HPV may also be done on women of any age with unclear Pap test results.  Other health care providers may not recommend any screening for nonpregnant women who are considered low risk for pelvic cancer and who do not have symptoms. Ask your health care provider if a screening pelvic exam is right for you.  If you have had past treatment for cervical cancer or a condition that could lead to cancer, you need Pap tests and screening for cancer for at least 20 years after your treatment. If Pap tests have been discontinued, your risk factors (such as having a new sexual partner) need to be reassessed to determine if screening should resume. Some women have medical problems that increase the chance of getting cervical cancer. In these cases, your health care provider may recommend more frequent screening and Pap tests.  Colorectal Cancer  This type of cancer can be detected and often prevented.  Routine colorectal cancer screening usually begins at 45  years of age and continues through 45 years of age.  Your health care provider may recommend screening at an earlier age if you have risk factors for colon cancer.  Your health care provider may also recommend using home test kits to check for hidden blood in the stool.  A small camera at the end of a tube can be used to examine your colon directly (sigmoidoscopy or colonoscopy). This is done to check for the earliest forms of colorectal cancer.  Routine screening usually begins at age 50.  Direct examination of the colon should be repeated every 5-10 years through 45 years of age. However, you may need to be screened more often if early forms of precancerous polyps or small growths are found.  Skin Cancer  Check your skin from head to toe regularly.  Tell your health care provider about any new moles or changes in moles, especially if there is a change in a mole's shape or color.  Also tell your   health care provider if you have a mole that is larger than the size of a pencil eraser.  Always use sunscreen. Apply sunscreen liberally and repeatedly throughout the day.  Protect yourself by wearing long sleeves, pants, a wide-brimmed hat, and sunglasses whenever you are outside.  Heart disease, diabetes, and high blood pressure  High blood pressure causes heart disease and increases the risk of stroke. High blood pressure is more likely to develop in: ? People who have blood pressure in the high end of the normal range (130-139/85-89 mm Hg). ? People who are overweight or obese. ? People who are African American.  If you are 18-39 years of age, have your blood pressure checked every 3-5 years. If you are 40 years of age or older, have your blood pressure checked every year. You should have your blood pressure measured twice-once when you are at a hospital or clinic, and once when you are not at a hospital or clinic. Record the average of the two measurements. To check your blood pressure  when you are not at a hospital or clinic, you can use: ? An automated blood pressure machine at a pharmacy. ? A home blood pressure monitor.  If you are between 55 years and 79 years old, ask your health care provider if you should take aspirin to prevent strokes.  Have regular diabetes screenings. This involves taking a blood sample to check your fasting blood sugar level. ? If you are at a normal weight and have a low risk for diabetes, have this test once every three years after 45 years of age. ? If you are overweight and have a high risk for diabetes, consider being tested at a younger age or more often. Preventing infection Hepatitis B  If you have a higher risk for hepatitis B, you should be screened for this virus. You are considered at high risk for hepatitis B if: ? You were born in a country where hepatitis B is common. Ask your health care provider which countries are considered high risk. ? Your parents were born in a high-risk country, and you have not been immunized against hepatitis B (hepatitis B vaccine). ? You have HIV or AIDS. ? You use needles to inject street drugs. ? You live with someone who has hepatitis B. ? You have had sex with someone who has hepatitis B. ? You get hemodialysis treatment. ? You take certain medicines for conditions, including cancer, organ transplantation, and autoimmune conditions.  Hepatitis C  Blood testing is recommended for: ? Everyone born from 1945 through 1965. ? Anyone with known risk factors for hepatitis C.  Sexually transmitted infections (STIs)  You should be screened for sexually transmitted infections (STIs) including gonorrhea and chlamydia if: ? You are sexually active and are younger than 45 years of age. ? You are older than 45 years of age and your health care provider tells you that you are at risk for this type of infection. ? Your sexual activity has changed since you were last screened and you are at an increased  risk for chlamydia or gonorrhea. Ask your health care provider if you are at risk.  If you do not have HIV, but are at risk, it may be recommended that you take a prescription medicine daily to prevent HIV infection. This is called pre-exposure prophylaxis (PrEP). You are considered at risk if: ? You are sexually active and do not regularly use condoms or know the HIV status of your partner(s). ?   You take drugs by injection. ? You are sexually active with a partner who has HIV.  Talk with your health care provider about whether you are at high risk of being infected with HIV. If you choose to begin PrEP, you should first be tested for HIV. You should then be tested every 3 months for as long as you are taking PrEP. Pregnancy  If you are premenopausal and you may become pregnant, ask your health care provider about preconception counseling.  If you may become pregnant, take 400 to 800 micrograms (mcg) of folic acid every day.  If you want to prevent pregnancy, talk to your health care provider about birth control (contraception). Osteoporosis and menopause  Osteoporosis is a disease in which the bones lose minerals and strength with aging. This can result in serious bone fractures. Your risk for osteoporosis can be identified using a bone density scan.  If you are 76 years of age or older, or if you are at risk for osteoporosis and fractures, ask your health care provider if you should be screened.  Ask your health care provider whether you should take a calcium or vitamin D supplement to lower your risk for osteoporosis.  Menopause may have certain physical symptoms and risks.  Hormone replacement therapy may reduce some of these symptoms and risks. Talk to your health care provider about whether hormone replacement therapy is right for you. Follow these instructions at home:  Schedule regular health, dental, and eye exams.  Stay current with your immunizations.  Do not use any  tobacco products including cigarettes, chewing tobacco, or electronic cigarettes.  If you are pregnant, do not drink alcohol.  If you are breastfeeding, limit how much and how often you drink alcohol.  Limit alcohol intake to no more than 1 drink per day for nonpregnant women. One drink equals 12 ounces of beer, 5 ounces of wine, or 1 ounces of hard liquor.  Do not use street drugs.  Do not share needles.  Ask your health care provider for help if you need support or information about quitting drugs.  Tell your health care provider if you often feel depressed.  Tell your health care provider if you have ever been abused or do not feel safe at home. This information is not intended to replace advice given to you by your health care provider. Make sure you discuss any questions you have with your health care provider. Document Released: 08/19/2010 Document Revised: 07/12/2015 Document Reviewed: 11/07/2014 Elsevier Interactive Patient Education  [employer].

## 2017-08-27 NOTE — Addendum Note (Signed)
Addended by: Elouise Munroe on: 08/27/2017 01:59 PM   Modules accepted: Orders

## 2017-10-01 ENCOUNTER — Ambulatory Visit: Payer: 59 | Admitting: Family Medicine

## 2017-10-01 ENCOUNTER — Encounter: Payer: Self-pay | Admitting: Family Medicine

## 2017-10-01 VITALS — BP 122/76 | HR 72 | Ht 63.0 in

## 2017-10-01 DIAGNOSIS — M25562 Pain in left knee: Secondary | ICD-10-CM

## 2017-10-01 MED ORDER — IBUPROFEN-FAMOTIDINE 800-26.6 MG PO TABS: 1.0000 | ORAL_TABLET | Freq: Three times a day (TID) | ORAL | 3 refills | Status: DC

## 2017-10-01 NOTE — Patient Instructions (Signed)
Nice to meet you  Please try the exercises Please take the medication if your pain is significant  Please try ice and compression on your knee  Please follow up with me in 3-4 weeks if your pain doesn't improve

## 2017-10-01 NOTE — Progress Notes (Signed)
Alyssa Melendez - 47 y.o. female MRN [id number]  Date of birth: 1970/03/19  SUBJECTIVE:  Including CC & ROS.  Chief Complaint  Patient presents with  . Left knee pain    Alyssa Melendez is a 47 y.o. female that is here today left knee pain. Pain started three weeks ago. She walks daily for exercises for an hour and noticed the pain afterwards. Pain is located in the anterior knee. Pain is mild to severe. Worsens with flexion and extension and bearing weight. 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 Denies injury or trauma. She has been taking motrin and applying ice.      Review of Systems  Constitutional: Negative for fever.  HENT: Negative for congestion.   Respiratory: Negative for cough.   Cardiovascular: Negative for chest pain.  Gastrointestinal: Negative for abdominal pain.  Musculoskeletal: Negative for back pain.  Skin: Negative for color change.  Neurological: Negative for weakness.  Hematological: Negative for adenopathy.  Psychiatric/Behavioral: Negative for agitation.    HISTORY: Past Medical, Surgical, Social, and Family History Reviewed & Updated per EMR.   Pertinent Historical Findings include:  Past Medical History:  Diagnosis Date  . Barlow syndrome   . Complex ovarian cyst   . Congestive heart failure ([place])   . Fibroadenoma   . 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 Increased BMI   . LLQ pain   . 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 Mitral valve prolapse   . OAB (overactive bladder)     Past Surgical History:  Procedure Laterality Date  . BREAST BIOPSY Left    Negative 10+years ago  . BREAST SURGERY  2001   Benign  . CARDIAC DEFIBRILLATOR PLACEMENT  2009  . CARDIAC DEFIBRILLATOR PLACEMENT  June 2013  . CESAREAN SECTION  2007 and 2005  . MITRAL VALVE REPAIR      Allergies  Allergen Reactions  . Morphine And Related     GI - vomitting    Family History  Problem Relation  Age of Onset  . Hyperlipidemia Mother   . Cancer Mother 74       melanoma  . Arthritis Mother        bilateral total knee replacements  . Heart disease Father        CABG x 3   . Mental illness Father        Alzheimers Dementia  . Hyperlipidemia Father   . Stroke Father        ich  . Breast cancer Neg Hx   . Ovarian cancer Neg Hx   . Colon cancer Neg Hx      Social History   Socioeconomic History  . Marital status: Married    Spouse name: Not on file  . Number of children: Not on file  . Years of education: Not on file  . Highest education level: Not on file  Occupational History  . Not on file  Social Needs  . Financial resource strain: Not on file  . Food insecurity:    Worry: Not on file    Inability: Not on file  . Transportation needs:    Medical: Not on file    Non-medical: Not on file  Tobacco Use  . Smoking status: Never Smoker  . Smokeless tobacco: Never Used  Substance and Sexual Activity  . Alcohol use: Yes    Comment: occassionally  . Drug  use: No  . Sexual activity: Yes    Comment: vasectomy  Lifestyle  . Physical activity:    Days per week: 5 days    Minutes per session: 30 min  . Stress: Not on file  Relationships  . Social connections:    Talks on phone: Not on file    Gets together: Not on file    Attends religious service: Not on file    Active member of club or organization: Not on file    Attends meetings of clubs or organizations: Not on file    Relationship status: Not on file  . Intimate partner violence:    Fear of current or ex partner: Not on file    Emotionally abused: Not on file    Physically abused: Not on file    Forced sexual activity: Not on file  Other Topics Concern  . Not on file  Social History Narrative  . Not on file     PHYSICAL EXAM:  VS: BP 122/76 (BP Location: Left Arm, Patient Position: Sitting, Cuff Size: Normal)   Pulse 72   Ht 5' 3"  (1.6 m)   SpO2 98%   BMI 31.53 kg/m  Physical Exam Gen: NAD,  alert, cooperative with exam, well-appearing ENT: normal lips, normal nasal mucosa,  Eye: normal EOM, normal conjunctiva and lids CV:  no edema, +2 pedal pulses   Resp: no accessory muscle use, non-labored,  Skin: no rashes, no areas of induration  Neuro: normal tone, normal sensation to touch Psych:  normal insight, alert and oriented MSK:  Left knee:  No obvious effusion  No TTP of the medial or lateral joint line  Normal IR and ER of the hip  Normal strength to resistance with knee flexion and extension, plantarflexion and dorsalflexion  No instability  Negative McMurray's test  No pain with patellar grind or compression  Neurovascularly intact   Limited ultrasound: left knee:  No effusion  Normal appearing QT and PT  Mild degenerative narrowing of the medial joint line with mild outpouching of the medial meniscus  Normal appearing lateral joint line   Summary: mild degenerative changes   Ultrasound and interpretation by Clearance Coots, MD            ASSESSMENT & PLAN:   Acute pain of left knee 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 Likely exacerbated with her standing and walking due to the hip abduction weakness.  - counseled on HEP  - duexis  - counseled on supportive care - if no improvement consider imaging, injection or PT

## 2017-10-02 DIAGNOSIS — M25562 Pain in left knee: Secondary | ICD-10-CM | POA: Insufficient documentation

## 2017-10-02 NOTE — Assessment & Plan Note (Addendum)
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 Likely exacerbated with her standing and walking due to the hip abduction weakness.  - counseled on HEP  - duexis  - counseled on supportive care - if no improvement consider imaging, injection or PT

## 2018-01-19 ENCOUNTER — Other Ambulatory Visit: Payer: Self-pay | Admitting: Internal Medicine

## 2018-01-19 DIAGNOSIS — N6320 Unspecified lump in the left breast, unspecified quadrant: Secondary | ICD-10-CM

## 2018-02-02 ENCOUNTER — Ambulatory Visit
Admission: RE | Admit: 2018-02-02 | Discharge: 2018-02-02 | Disposition: A | Discharge status: Home / Self Care | Payer: 59 | Source: Ambulatory Visit | Attending: Internal Medicine | Admitting: Internal Medicine

## 2018-02-02 DIAGNOSIS — N6320 Unspecified lump in the left breast, unspecified quadrant: Secondary | ICD-10-CM

## 2018-02-02 DIAGNOSIS — N6321 Unspecified lump in the left breast, upper outer quadrant: Secondary | ICD-10-CM | POA: Diagnosis not present

## 2018-02-02 DIAGNOSIS — R922 Inconclusive mammogram: Secondary | ICD-10-CM | POA: Diagnosis not present

## 2018-03-02 ENCOUNTER — Encounter: Payer: Self-pay | Admitting: Internal Medicine

## 2018-03-02 ENCOUNTER — Ambulatory Visit (INDEPENDENT_AMBULATORY_CARE_PROVIDER_SITE_OTHER): Payer: 59 | Admitting: Internal Medicine

## 2018-03-02 VITALS — BP 110/72 | HR 70 | Temp 97.4°F | Resp 14 | Ht 63.0 in | Wt 175.6 lb

## 2018-03-02 DIAGNOSIS — E782 Mixed hyperlipidemia: Secondary | ICD-10-CM | POA: Diagnosis not present

## 2018-03-02 DIAGNOSIS — R7301 Impaired fasting glucose: Secondary | ICD-10-CM

## 2018-03-02 DIAGNOSIS — Z0000 Encounter for general adult medical examination without abnormal findings: Secondary | ICD-10-CM | POA: Diagnosis not present

## 2018-03-02 LAB — COMPREHENSIVE METABOLIC PANEL
ALK PHOS: 51 U/L (ref 39–117)
ALT: 12 U/L (ref 0–35)
AST: 16 U/L (ref 0–37)
Albumin: 4.1 g/dL (ref 3.5–5.2)
BILIRUBIN TOTAL: 0.4 mg/dL (ref 0.2–1.2)
BUN: 10 mg/dL (ref 6–23)
CALCIUM: 9.1 mg/dL (ref 8.4–10.5)
CO2: 26 meq/L (ref 19–32)
Chloride: 104 mEq/L (ref 96–112)
Creatinine, Ser: 0.85 mg/dL (ref 0.40–1.20)
GFR: 76.68 mL/min (ref >60.00)
Glucose, Bld: 83 mg/dL (ref 70–99)
Potassium: 3.9 mEq/L (ref 3.5–5.1)
Sodium: 138 mEq/L (ref 135–145)
TOTAL PROTEIN: 6.7 g/dL (ref 6.0–8.3)

## 2018-03-02 LAB — LIPID PANEL
CHOL/HDL RATIO: 4
Cholesterol: 189 mg/dL (ref 0–200)
HDL: 48.4 mg/dL (ref >39.00)
LDL Cholesterol: 124 mg/dL — ABNORMAL HIGH (ref 0–99)
NonHDL: 140.77
TRIGLYCERIDES: 85 mg/dL (ref 0.0–149.0)
VLDL: 17 mg/dL (ref 0.0–40.0)

## 2018-03-02 LAB — HEMOGLOBIN A1C: Hgb A1c MFr Bld: 5.5 % (ref 4.6–6.5)

## 2018-03-02 MED ORDER — VALACYCLOVIR HCL 1 G PO TABS: 1000.0000 mg | ORAL_TABLET | Freq: Three times a day (TID) | ORAL | 0 refills | Status: DC

## 2018-03-02 MED ORDER — ZOSTER VAC RECOMB ADJUVANTED 50 MCG/0.5ML IM SUSR: 0.5000 mL | Freq: Once | INTRAMUSCULAR | 1 refills | Status: AC

## 2018-03-02 NOTE — Patient Instructions (Signed)
Use the valtrex the next time you have a shingles outbreak    The ShingRx vaccine is now available in local pharmacies and is much more protective than the old one  Zostavax  (it is about 97%  Effective in preventing shingles). .   It is therefore ADVISED for all interested adults over 50 to prevent shingles so I have printed you a prescription for it.  (it requires a 2nd dose 2 too 6 months after the first one) .  It will cause you to have flu  like symptoms for 2 days   Once you have your defibrillator replaced,  We'll make a referral to  general surgery for removal of your lipoma  Health Maintenance, Female Adopting a healthy lifestyle and getting preventive care can go a long way to promote health and wellness. Talk with your health care provider about what schedule of regular examinations is right for you. This is a good chance for you to check in with your provider about disease prevention and staying healthy. In between checkups, there are plenty of things you can do on your own. Experts have done a lot of research about which lifestyle changes and preventive measures are most likely to keep you healthy. Ask your health care provider for more information. Weight and diet Eat a healthy diet  Be sure to include plenty of vegetables, fruits, low-fat dairy products, and lean protein.  Do not eat a lot of foods high in solid fats, added sugars, or salt.  Get regular exercise. This is one of the most important things you can do for your health. ? Most adults should exercise for at least 150 minutes each week. The exercise should increase your heart rate and make you sweat (moderate-intensity exercise). ? Most adults should also do strengthening exercises at least twice a week. This is in addition to the moderate-intensity exercise. Maintain a healthy weight  Body mass index (BMI) is a measurement that can be used to identify possible weight problems. It estimates body fat based on height and  weight. Your health care provider can help determine your BMI and help you achieve or maintain a healthy weight.  For females 60 years of age and older: ? A BMI below 18.5 is considered underweight. ? A BMI of 18.5 to 24.9 is normal. ? A BMI of 25 to 29.9 is considered overweight. ? A BMI of 30 and above is considered obese. Watch levels of cholesterol and blood lipids  You should start having your blood tested for lipids and cholesterol at 46 years of age, then have this test every 5 years.  You may need to have your cholesterol levels checked more often if: ? Your lipid or cholesterol levels are high. ? You are older than 46 years of age. ? You are at high risk for heart disease. Cancer screening Lung Cancer  Lung cancer screening is recommended for adults 25-33 years old who are at high risk for lung cancer because of a history of smoking.  A yearly low-dose CT scan of the lungs is recommended for people who: ? Currently smoke. ? Have quit within the past 15 years. ? Have at least a 30-pack-year history of smoking. A pack year is smoking an average of one pack of cigarettes a day for 1 year.  Yearly screening should continue until it has been 15 years since you quit.  Yearly screening should stop if you develop a health problem that would prevent you from having lung cancer  treatment. Breast Cancer  Practice breast self-awareness. This means understanding how your breasts normally appear and feel.  It also means doing regular breast self-exams. Let your health care provider know about any changes, no matter how small.  If you are in your 20s or 30s, you should have a clinical breast exam (CBE) by a health care provider every 1-3 years as part of a regular health exam.  If you are 70 or older, have a CBE every year. Also consider having a breast X-ray (mammogram) every year.  If you have a family history of breast cancer, talk to your health care provider about genetic  screening.  If you are at high risk for breast cancer, talk to your health care provider about having an MRI and a mammogram every year.  Breast cancer gene (BRCA) assessment is recommended for women who have family members with BRCA-related cancers. BRCA-related cancers include: ? Breast. ? Ovarian. ? Tubal. ? Peritoneal cancers.  Results of the assessment will determine the need for genetic counseling and BRCA1 and BRCA2 testing. Cervical Cancer Your health care provider may recommend that you be screened regularly for cancer of the pelvic organs (ovaries, uterus, and vagina). This screening involves a pelvic examination, including checking for microscopic changes to the surface of your cervix (Pap test). You may be encouraged to have this screening done every 3 years, beginning at age 35.  For women ages 2-65, health care providers may recommend pelvic exams and Pap testing every 3 years, or they may recommend the Pap and pelvic exam, combined with testing for human papilloma virus (HPV), every 5 years. Some types of HPV increase your risk of cervical cancer. Testing for HPV may also be done on women of any age with unclear Pap test results.  Other health care providers may not recommend any screening for nonpregnant women who are considered low risk for pelvic cancer and who do not have symptoms. Ask your health care provider if a screening pelvic exam is right for you.  If you have had past treatment for cervical cancer or a condition that could lead to cancer, you need Pap tests and screening for cancer for at least 20 years after your treatment. If Pap tests have been discontinued, your risk factors (such as having a new sexual partner) need to be reassessed to determine if screening should resume. Some women have medical problems that increase the chance of getting cervical cancer. In these cases, your health care provider may recommend more frequent screening and Pap tests. Colorectal  Cancer  This type of cancer can be detected and often prevented.  Routine colorectal cancer screening usually begins at 46 years of age and continues through 46 years of age.  Your health care provider may recommend screening at an earlier age if you have risk factors for colon cancer.  Your health care provider may also recommend using home test kits to check for hidden blood in the stool.  A small camera at the end of a tube can be used to examine your colon directly (sigmoidoscopy or colonoscopy). This is done to check for the earliest forms of colorectal cancer.  Routine screening usually begins at age 8.  Direct examination of the colon should be repeated every 5-10 years through 46 years of age. However, you may need to be screened more often if early forms of precancerous polyps or small growths are found. Skin Cancer  Check your skin from head to toe regularly.  Tell your health  care provider about any new moles or changes in moles, especially if there is a change in a mole's shape or color.  Also tell your health care provider if you have a mole that is larger than the size of a pencil eraser.  Always use sunscreen. Apply sunscreen liberally and repeatedly throughout the day.  Protect yourself by wearing long sleeves, pants, a wide-brimmed hat, and sunglasses whenever you are outside. Heart disease, diabetes, and high blood pressure  High blood pressure causes heart disease and increases the risk of stroke. High blood pressure is more likely to develop in: ? People who have blood pressure in the high end of the normal range (130-139/85-89 mm Hg). ? People who are overweight or obese. ? People who are African American.  If you are 18-14 years of age, have your blood pressure checked every 3-5 years. If you are 45 years of age or older, have your blood pressure checked every year. You should have your blood pressure measured twice-once when you are at a hospital or clinic,  and once when you are not at a hospital or clinic. Record the average of the two measurements. To check your blood pressure when you are not at a hospital or clinic, you can use: ? An automated blood pressure machine at a pharmacy. ? A home blood pressure monitor.  If you are between 42 years and 31 years old, ask your health care provider if you should take aspirin to prevent strokes.  Have regular diabetes screenings. This involves taking a blood sample to check your fasting blood sugar level. ? If you are at a normal weight and have a low risk for diabetes, have this test once every three years after 46 years of age. ? If you are overweight and have a high risk for diabetes, consider being tested at a younger age or more often. Preventing infection Hepatitis B  If you have a higher risk for hepatitis B, you should be screened for this virus. You are considered at high risk for hepatitis B if: ? You were born in a country where hepatitis B is common. Ask your health care provider which countries are considered high risk. ? Your parents were born in a high-risk country, and you have not been immunized against hepatitis B (hepatitis B vaccine). ? You have HIV or AIDS. ? You use needles to inject street drugs. ? You live with someone who has hepatitis B. ? You have had sex with someone who has hepatitis B. ? You get hemodialysis treatment. ? You take certain medicines for conditions, including cancer, organ transplantation, and autoimmune conditions. Hepatitis C  Blood testing is recommended for: ? Everyone born from [birth year] through 1963. ? Anyone with known risk factors for hepatitis C. Sexually transmitted infections (STIs)  You should be screened for sexually transmitted infections (STIs) including gonorrhea and chlamydia if: ? You are sexually active and are younger than 46 years of age. ? You are older than 46 years of age and your health care provider tells you that you are at risk  for this type of infection. ? Your sexual activity has changed since you were last screened and you are at an increased risk for chlamydia or gonorrhea. Ask your health care provider if you are at risk.  If you do not have HIV, but are at risk, it may be recommended that you take a prescription medicine daily to prevent HIV infection. This is called pre-exposure prophylaxis (PrEP). You are considered  at risk if: ? You are sexually active and do not regularly use condoms or know the HIV status of your partner(s). ? You take drugs by injection. ? You are sexually active with a partner who has HIV. Talk with your health care provider about whether you are at high risk of being infected with HIV. If you choose to begin PrEP, you should first be tested for HIV. You should then be tested every 3 months for as long as you are taking PrEP. Pregnancy  If you are premenopausal and you may become pregnant, ask your health care provider about preconception counseling.  If you may become pregnant, take 400 to 800 micrograms (mcg) of folic acid every day.  If you want to prevent pregnancy, talk to your health care provider about birth control (contraception). Osteoporosis and menopause  Osteoporosis is a disease in which the bones lose minerals and strength with aging. This can result in serious bone fractures. Your risk for osteoporosis can be identified using a bone density scan.  If you are 24 years of age or older, or if you are at risk for osteoporosis and fractures, ask your health care provider if you should be screened.  Ask your health care provider whether you should take a calcium or vitamin D supplement to lower your risk for osteoporosis.  Menopause may have certain physical symptoms and risks.  Hormone replacement therapy may reduce some of these symptoms and risks. Talk to your health care provider about whether hormone replacement therapy is right for you. Follow these instructions at  home:  Schedule regular health, dental, and eye exams.  Stay current with your immunizations.  Do not use any tobacco products including cigarettes, chewing tobacco, or electronic cigarettes.  If you are pregnant, do not drink alcohol.  If you are breastfeeding, limit how much and how often you drink alcohol.  Limit alcohol intake to no more than 1 drink per day for nonpregnant women. One drink equals 12 ounces of beer, 5 ounces of wine, or 1 ounces of hard liquor.  Do not use street drugs.  Do not share needles.  Ask your health care provider for help if you need support or information about quitting drugs.  Tell your health care provider if you often feel depressed.  Tell your health care provider if you have ever been abused or do not feel safe at home. This information is not intended to replace advice given to you by your health care provider. Make sure you discuss any questions you have with your health care provider. Document Released: 08/19/2010 Document Revised: 07/12/2015 Document Reviewed: 11/07/2014 Elsevier Interactive Patient Education  2019 [employer].

## 2018-03-02 NOTE — Progress Notes (Signed)
Patient ID: Alyssa Melendez, female    DOB: 11-30-1970  Age: 48 y.o. MRN: [id number]  The patient is here for annual preventive examination and management of other chronic and acute problems.    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  The roster of all physicians providing medical care to patient - is listed in the Snapshot section of the chart.  Activities of daily living:  The patient is 100% independent in all ADLs: dressing, toileting, feeding as well as independent mobility  Home safety : The patient has smoke detectors in the home. They wear seatbelts.  There are no firearms at home. There is no violence in the home.   There is no risks for hepatitis, STDs or HIV. There is no   history of blood transfusion. They have no travel history to infectious disease endemic areas of the world.  The patient has seen their dentist in the last six month. They have seen their eye doctor in the last year. They admit to slight hearing difficulty with regard to whispered voices and some television programs.  They have deferred audiologic testing in the last year.  They do not  have excessive sun exposure. Discussed the need for sun protection: hats, long sleeves and use of sunscreen if there is significant sun exposure.   Diet: the importance of a healthy diet is discussed. They do have a healthy diet.  The benefits of regular aerobic exercise were discussed. She walks 4 times per week ,  20 minutes.   Depression screen: there are no signs or vegative symptoms of depression- irritability, change in appetite, anhedonia, sadness/tearfullness.  Cognitive assessment: the patient manages all their financial and personal affairs and is actively engaged. They could relate day,date,year and events; recalled 2/3 objects at 3 minutes;  performed clock-face test normally.  The following portions of the patient's history were reviewed and updated as appropriate: allergies, current medications, past family history, past medical history,  past surgical history, past social history  and problem list.  Visual acuity was not assessed per patient preference since she has regular follow up with her ophthalmologist. Hearing and body mass index were assessed and reviewed.   During the course of the visit the patient was educated and counseled about appropriate screening and preventive services including : fall prevention , diabetes screening, nutrition counseling, colorectal cancer screening, and recommended immunizations.    CC: The primary encounter diagnosis was Moderate mixed hyperlipidemia not requiring statin therapy. Diagnoses of Impaired fasting glucose and Routine general medical examination at a health care facility were also pertinent to this visit.  History Alyssa Melendez has a past medical history of Barlow syndrome, Complex ovarian cyst, Congestive heart failure ([place]), Fibroadenoma, sudden cardiac arrest (2009), Increased BMI, LLQ pain, Long Q-T syndrome (2009), Mitral valve prolapse, and OAB (overactive bladder).   She has a past surgical history that includes Cardiac defibrillator placement (2009); Cesarean section (2007 and 2005); Breast surgery (2001); Cardiac defibrillator placement (June 2013); Mitral valve repair; and Breast biopsy (Left).   Her family history includes Arthritis in her mother; Cancer (age of onset: 53) in her mother; Heart disease in her father; Hyperlipidemia in her father and mother; Mental illness in her father; Stroke in her father.She reports that she has never smoked. She has  never used smokeless tobacco. She reports current alcohol use. She reports that she does not use drugs.  Outpatient Medications Prior to Visit  Medication Sig Dispense Refill  . ALPRAZolam (XANAX) 0.25 MG tablet Take 0.25 mg by  mouth at bedtime as needed for anxiety.    Marland Kitchen aspirin 81 MG tablet Take 81 mg by mouth daily.    Marland Kitchen atenolol (TENORMIN) 25 MG tablet Take 25 mg by mouth daily.    . Cholecalciferol 2000 UNITS CAPS Take 1 capsule by mouth daily.    . Ibuprofen-Famotidine 800-26.6 MG TABS Take 1 tablet by mouth 3 (three) times daily. (Patient not taking: Reported on 03/02/2018) 90 tablet 3  . Multiple Vitamin (MULTIVITAMIN) tablet Take 1 tablet by mouth daily.       No facility-administered medications prior to visit.     Review of Systems   Patient denies headache, fevers, malaise, unintentional weight loss, skin rash, eye pain, sinus congestion and sinus pain, sore throat, dysphagia,  hemoptysis , cough, dyspnea, wheezing, chest pain, palpitations, orthopnea, edema, abdominal pain, nausea, melena, diarrhea, constipation, flank pain, dysuria, hematuria, urinary  Frequency, nocturia, numbness, tingling, seizures,  Focal weakness, Loss of consciousness,  Tremor, insomnia, depression, anxiety, and suicidal ideation.     Objective:  BP 110/72 (BP Location: Left Arm, Patient Position: Sitting, Cuff Size: Large)   Pulse 70   Temp (!) 97.4 F (36.3 C) (Oral)   Resp 14   Ht 5' 3"  (1.6 m)   Wt 175 lb 9.6 oz (79.7 kg)   SpO2 99%   BMI 31.11 kg/m   Physical Exam   General appearance: alert, cooperative and appears stated age Ears: normal TM's and external ear canals both ears Throat: lips, mucosa, and tongue normal; teeth and gums normal Neck: no adenopathy, no carotid bruit, supple, symmetrical, trachea midline and thyroid not enlarged, symmetric, no tenderness/mass/nodules Back: symmetric, no curvature. ROM normal. No CVA tenderness. Lungs: clear to auscultation bilaterally Heart: regular rate and rhythm, S1, S2 normal, no murmur, click, rub or gallop Abdomen: soft, non-tender; bowel sounds normal; no masses,  no organomegaly Pulses: 2+ and symmetric Skin: Skin color, texture, turgor normal. No rashes or  lesions Lymph nodes: Cervical, supraclavicular, and axillary nodes normal.    Assessment & Plan:   Problem List Items Addressed This Visit    Hyperlipidemia - Primary    LDL and triglycerides are at goal on current medications.10% risk of CAD is 3% . She has no side effects and liver enzymes are normal. continue red yeast rice.  Lab Results  Component Value Date   CHOL 189 03/02/2018   HDL 48.40 03/02/2018   LDLCALC 124 (H) 03/02/2018   LDLDIRECT 147.3 02/21/2013   TRIG 85.0 03/02/2018   CHOLHDL 4 03/02/2018          Relevant Orders   Lipid panel (Completed)   Routine general medical examination at a health care facility    age appropriate education and counseling updated, referrals for preventative services and immunizations addressed, dietary and smoking counseling addressed, most recent labs reviewed.  I have personally reviewed and have noted:  1) the patient's medical and social history 2) The pt's use of alcohol, tobacco, and illicit drugs 3) The patient's current medications and supplements 4) Functional ability including ADL's, fall risk, home safety risk, hearing and visual impairment 5) Diet and physical activities 6) Evidence for depression or mood disorder 7) The patient's height, weight, and BMI have been recorded in the chart  I have  made referrals, and provided counseling and education based on review of the above       Other Visit Diagnoses    Impaired fasting glucose       Relevant Orders   Hemoglobin A1c (Completed)   Comprehensive metabolic panel (Completed)      I have discontinued Manmeet Blumer's multivitamin and Ibuprofen-Famotidine. I am also having her start on Zoster Vaccine Adjuvanted. Additionally, I am having her maintain her aspirin, Cholecalciferol, ALPRAZolam, atenolol, and valACYclovir.  Meds ordered this encounter  Medications  . valACYclovir (VALTREX) 1000 MG tablet    Sig: Take 1 tablet (1,000 mg total) by mouth 3 (three)  times daily.    Dispense:  21 tablet    Refill:  0  . Zoster Vaccine Adjuvanted [facility]) injection    Sig: Inject 0.5 mLs into the muscle once for 1 dose.    Dispense:  1 each    Refill:  1    Medications Discontinued During This Encounter  Medication Reason  . Ibuprofen-Famotidine 800-26.6 MG TABS Patient has not taken in last 30 days  . Multiple Vitamin (MULTIVITAMIN) tablet Patient has not taken in last 30 days    Follow-up: Return in about 1 year (around 03/03/2019).   Crecencio Mc, MD

## 2018-03-03 DIAGNOSIS — I4581 Long QT syndrome: Secondary | ICD-10-CM | POA: Diagnosis not present

## 2018-03-03 DIAGNOSIS — Z9889 Other specified postprocedural states: Secondary | ICD-10-CM | POA: Diagnosis not present

## 2018-03-03 DIAGNOSIS — Z95810 Presence of automatic (implantable) cardiac defibrillator: Secondary | ICD-10-CM | POA: Diagnosis not present

## 2018-03-03 NOTE — Assessment & Plan Note (Signed)
LDL and triglycerides are at goal on current medications.10% risk of CAD is 3% . She has no side effects and liver enzymes are normal. continue red yeast rice.  Lab Results  Component Value Date   CHOL 189 03/02/2018   HDL 48.40 03/02/2018   LDLCALC 124 (H) 03/02/2018   LDLDIRECT 147.3 02/21/2013   TRIG 85.0 03/02/2018   CHOLHDL 4 03/02/2018

## 2018-03-03 NOTE — Assessment & Plan Note (Signed)

## 2018-03-17 ENCOUNTER — Telehealth: Payer: Self-pay | Admitting: Internal Medicine

## 2018-03-17 NOTE — Telephone Encounter (Signed)
Copied from [place] Caryl [phone]. Topic: Quick Communication - See Telephone Encounter >> Mar 17, 2018  6:18 PM Valla Leaver wrote: CRM for notification. See Telephone encounter for: 03/17/18. Patient calling stating Dr. Derrel Nip forgot to call in her Xanax. Please send [id number] [place] [id number]: [phone]: [phone]

## 2018-03-18 ENCOUNTER — Other Ambulatory Visit: Payer: Self-pay | Admitting: Internal Medicine

## 2018-03-18 MED ORDER — ALPRAZOLAM 0.25 MG PO TABS: 0.2500 mg | ORAL_TABLET | Freq: Every evening | ORAL | 5 refills | Status: DC | PRN

## 2018-03-18 NOTE — Progress Notes (Signed)
Xanax refilled and sent

## 2018-03-18 NOTE — Telephone Encounter (Signed)
Done

## 2018-08-31 ENCOUNTER — Encounter: Payer: 59 | Admitting: Obstetrics and Gynecology

## 2018-09-01 ENCOUNTER — Encounter: Payer: Self-pay | Admitting: Obstetrics and Gynecology

## 2018-12-29 ENCOUNTER — Other Ambulatory Visit: Payer: Self-pay | Admitting: Internal Medicine

## 2018-12-30 ENCOUNTER — Other Ambulatory Visit: Payer: Self-pay | Admitting: Internal Medicine

## 2018-12-30 DIAGNOSIS — Z1231 Encounter for screening mammogram for malignant neoplasm of breast: Secondary | ICD-10-CM

## 2019-03-15 ENCOUNTER — Ambulatory Visit
Admission: RE | Admit: 2019-03-15 | Discharge: 2019-03-15 | Disposition: A | Discharge status: Home / Self Care | Payer: 59 | Source: Ambulatory Visit | Attending: Internal Medicine | Admitting: Internal Medicine

## 2019-03-15 DIAGNOSIS — Z1231 Encounter for screening mammogram for malignant neoplasm of breast: Secondary | ICD-10-CM | POA: Insufficient documentation

## 2019-06-10 ENCOUNTER — Ambulatory Visit: Payer: 59 | Admitting: Family Medicine

## 2019-08-09 ENCOUNTER — Ambulatory Visit (INDEPENDENT_AMBULATORY_CARE_PROVIDER_SITE_OTHER): Payer: No Typology Code available for payment source | Admitting: Certified Nurse Midwife

## 2019-08-09 ENCOUNTER — Encounter: Payer: Self-pay | Admitting: Certified Nurse Midwife

## 2019-08-09 ENCOUNTER — Other Ambulatory Visit: Payer: Self-pay

## 2019-08-09 VITALS — BP 95/75 | HR 87 | Ht 63.0 in | Wt 177.4 lb

## 2019-08-09 DIAGNOSIS — Z01419 Encounter for gynecological examination (general) (routine) without abnormal findings: Secondary | ICD-10-CM | POA: Diagnosis not present

## 2019-08-09 NOTE — Progress Notes (Signed)
GYNECOLOGY ANNUAL PREVENTATIVE CARE ENCOUNTER NOTE  History:     Alyssa Melendez is a 49 y.o. G29P2002 female here for a routine annual gynecologic exam.  Current complaints: none.   Denies abnormal vaginal bleeding, discharge, pelvic pain, problems with intercourse or other gynecologic concerns.     Social Relationship: spouse Living: with husband Work: Works Animal nutritionist for [employer] Exercise: walks 3 x week for 30-40 min.  Smoke/Drink/Drugs:Denies   Gynecologic History Patient's last menstrual period was 07/21/2019 (exact date). Contraception: vasectomy Last Pap: 12/20/2015. Results were: normal with negative HPV Last mammogram: 03/15/19. Results were: normal  Obstetric History OB History  Gravida Para Term Preterm AB Living  2 2 2     2   SAB TAB Ectopic Multiple Live Births          2    # Outcome Date GA Lbr Len/2nd Weight Sex Delivery Anes PTL Lv  2 Term      Vag-Spont   LIV  1 Term      Vag-Spont   LIV    Past Medical History:  Diagnosis Date  . Barlow syndrome   . Complex ovarian cyst   . Congestive heart failure ([place])   . Fibroadenoma   . 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 Increased BMI   . LLQ pain   . 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 Mitral valve prolapse   . OAB (overactive bladder)     Past Surgical History:  Procedure Laterality Date  . BREAST BIOPSY Left    Negative 10+years ago  . BREAST SURGERY  2001   Benign  . CARDIAC DEFIBRILLATOR PLACEMENT  2009  . CARDIAC DEFIBRILLATOR PLACEMENT  June 2013  . CESAREAN SECTION  2007 and 2005  . MITRAL VALVE REPAIR      Current Outpatient Medications on File Prior to Visit  Medication Sig Dispense Refill  . ALPRAZolam (XANAX) 0.25 MG tablet Take 1 tablet (0.25 mg total) by mouth at bedtime as needed for anxiety. 30 tablet 5  . aspirin 81 MG tablet Take 81 mg by mouth daily.    Marland Kitchen atenolol (TENORMIN) 25 MG tablet Take 25 mg by  mouth daily.    . Cholecalciferol 2000 UNITS CAPS Take 1 capsule by mouth daily.     No current facility-administered medications on file prior to visit.    Allergies  Allergen Reactions  . Morphine And Related     GI - vomitting    Social History:  reports that she has never smoked. She has never used smokeless tobacco. She reports current alcohol use. She reports that she does not use drugs.  Family History  Problem Relation Age of Onset  . Hyperlipidemia Mother   . Cancer Mother 58       melanoma  . Arthritis Mother        bilateral total knee replacements  . Heart disease Father        CABG x 3   . Mental illness Father        Alzheimers Dementia  . Hyperlipidemia Father   . Stroke Father        ich  . Breast cancer Neg Hx   . Ovarian cancer Neg Hx   . Colon cancer Neg Hx     The following portions of the patient's history were reviewed and updated as appropriate: allergies, current medications, past family history, past medical  history, past social history, past surgical history and problem list.  Review of Systems Pertinent items noted in HPI and remainder of comprehensive ROS otherwise negative.  Physical Exam:  BP 95/75   Pulse 87   Ht 5' 3"  (1.6 m)   Wt 177 lb 6 oz (80.5 kg)   LMP 07/21/2019 (Exact Date)   BMI 31.42 kg/m  CONSTITUTIONAL: Well-developed, well-nourished female, over weight, in no acute distress.  HENT:  Normocephalic, atraumatic, External right and left ear normal. Oropharynx is clear and moist EYES: Conjunctivae and EOM are normal. Pupils are equal, round, and reactive to light. No scleral icterus.  NECK: Normal range of motion, supple, no masses.  Normal thyroid.  SKIN: Skin is warm and dry. No rash noted. Not diaphoretic. No erythema. No pallor. MUSCULOSKELETAL: Normal range of motion. No tenderness.  No cyanosis, clubbing, or edema.  2+ distal pulses. NEUROLOGIC: Alert and oriented to person, place, and time. Normal reflexes, muscle tone  coordination.  PSYCHIATRIC: Normal mood and affect. Normal behavior. Normal judgment and thought content. CARDIOVASCULAR: Normal heart rate noted, regular rhythm RESPIRATORY: Clear to auscultation bilaterally. Effort and breath sounds normal, no problems with respiration noted. BREASTS: Symmetric in size. No masses, tenderness, skin changes, nipple drainage, or lymphadenopathy bilaterally.  ABDOMEN: Soft, no distention noted.  No tenderness, rebound or guarding.  PELVIC: Normal appearing external genitalia and urethral meatus; normal appearing vaginal mucosa and cervix.  No abnormal discharge noted.  Pap smear not indicated. Normal uterine size, no other palpable masses, no uterine or adnexal tenderness.  Performed in the presence of a chaperone.   Assessment and Plan:    1. Women's annual routine gynecological examination Pap smear not due Mammogram done Labs: none Refillls: none Referrals none Routine preventative health maintenance measures emphasized. Please refer to After Visit Summary for other counseling recommendations.      Philip Aspen, CNM

## 2019-08-09 NOTE — Patient Instructions (Signed)

## 2019-09-16 ENCOUNTER — Other Ambulatory Visit: Payer: Self-pay

## 2019-09-16 ENCOUNTER — Ambulatory Visit: Payer: No Typology Code available for payment source | Admitting: Internal Medicine

## 2019-09-16 ENCOUNTER — Encounter: Payer: Self-pay | Admitting: Internal Medicine

## 2019-09-16 ENCOUNTER — Ambulatory Visit
Admission: RE | Admit: 2019-09-16 | Discharge: 2019-09-16 | Disposition: A | Discharge status: Home / Self Care | Payer: No Typology Code available for payment source | Source: Ambulatory Visit | Attending: Internal Medicine | Admitting: Internal Medicine

## 2019-09-16 VITALS — BP 108/70 | HR 70 | Temp 98.1°F | Ht 63.0 in | Wt 178.0 lb

## 2019-09-16 DIAGNOSIS — M545 Low back pain, unspecified: Secondary | ICD-10-CM

## 2019-09-16 DIAGNOSIS — M542 Cervicalgia: Secondary | ICD-10-CM | POA: Diagnosis present

## 2019-09-16 DIAGNOSIS — G44311 Acute post-traumatic headache, intractable: Secondary | ICD-10-CM

## 2019-09-16 MED ORDER — CYCLOBENZAPRINE HCL 5 MG PO TABS: 5.0000 mg | ORAL_TABLET | Freq: Every evening | ORAL | 0 refills | Status: DC | PRN

## 2019-09-16 NOTE — Patient Instructions (Addendum)
Voltaren gel  Call back if not better next week consider prednisone   Post-Concussion Syndrome  Post-concussion syndrome is when symptoms last longer than normal after a head injury. What are the signs or symptoms? After a head injury, you may:  Have headaches.  Feel tired.  Feel dizzy.  Feel weak.  Have trouble seeing.  Have trouble in bright lights.  Have trouble hearing.  Not be able to remember things.  Not be able to focus.  Have trouble sleeping.  Have mood swings.  Have trouble learning new things. These can last from weeks to months. Follow these instructions at home: Medicines  Take all medicines only as told by your doctor.  Do not take prescription pain medicines. Activity  Limit activities as told by your doctor. This includes: ? Homework. ? Job-related work. ? Thinking. ? Watching TV. ? Using a computer or phone. ? Puzzles. ? Exercise. ? Sports.  Slowly return to your normal activity as told by your doctor.  Stop an activity if you have symptoms.  Do not do anything that may cause you to get injured again. General instructions  Rest. Try to: ? Sleep 7-9 hours each night. ? Take naps or breaks when you feel tired during the day.  Do not drink alcohol until your doctor says that you can.  Keep track of your symptoms.  Keep all follow-up visits as told by your doctor. This is important. Contact a doctor if:  You do not improve.  You get worse.  You have another injury. Get help right away if:  You have a very bad headache.  You feel confused.  You feel very sleepy.  You pass out (faint).  You throw up (vomit).  You feel weak in any part of your body.  You feel numb in any part of your body.  You start shaking (have a seizure).  You have trouble talking. Summary  Post-concussion syndrome is when symptoms last longer than normal after a head injury.  Limit all activity after your injury. Gradually return to  normal activity as told by your doctor.  Rest, do not drink alcohol, and avoid prescription pain medicines after a concussion.  Call your doctor if your symptoms get worse. This information is not intended to replace advice given to you by your health care provider. Make sure you discuss any questions you have with your health care provider. Document Revised: 11/26/2017 Document Reviewed: 03/10/2017 Elsevier Patient Education  [place], Adult  A concussion is a brain injury from a hard, direct hit (trauma) to the head or body. This direct hit causes the brain to shake quickly back and forth inside the skull. This can damage brain cells and cause chemical changes in the brain. A concussion may also be known as a mild traumatic brain injury (TBI). Concussions are usually not life-threatening, but the effects of a concussion can be serious. If you have a concussion, you should be very careful to avoid having a second concussion. What are the causes? This condition is caused by:  A direct hit to your head, such as: ? Running into another player during a game. ? Being hit in a fight. ? Hitting your head on a hard surface.  Sudden movement of your body that causes your brain to move back and forth inside the skull, such as in a car crash. What are the signs or symptoms? The signs of a concussion can be hard to notice. Early on, they may  be missed by you, family members, and health care providers. You may look fine on the outside but may act or feel differently. Symptoms are usually temporary and most often improve in 7-10 days. Some symptoms appear right away, but other symptoms may not show up for hours or days. If your symptoms last longer than normal, you may have post-concussion syndrome. Every head injury is different. Physical symptoms  Headaches. This can include a feeling of pressure in the head or migraine-like symptoms.  Tiredness  (fatigue).  Dizziness.  Problems with coordination or balance.  Vision or hearing problems.  Sensitivity to light or noise.  Nausea or vomiting.  Changes in eating or sleeping patterns.  Numbness or tingling.  Seizure. Mental and emotional symptoms  Memory problems.  Trouble concentrating, organizing, or making decisions.  Slowness in thinking, acting or reacting, speaking, or reading.  Irritability or mood changes.  Anxiety or depression. How is this diagnosed? This condition is diagnosed based on:  Your symptoms.  A description of your injury. You may also have tests, including:  Imaging tests, such as a CT scan or MRI.  Neuropsychological tests. These measure your thinking, understanding, learning, and remembering abilities. How is this treated? Treatment for this condition includes:  Stopping sports or activity if you are injured. If you hit your head or show signs of concussion: ? Do not return to sports or activities the same day. ? Get checked by a health care provider before you return to your activities.  Physical and mental rest and careful observation, usually at home. Gradually return to your normal activities.  Medicines to help with symptoms such as headaches, nausea, or difficulty sleeping. ? Avoid taking opioid pain medicine while recovering from a concussion.  Avoiding alcohol and drugs. These may slow your recovery and can put you at risk of further injury.  Referral to a concussion clinic or rehabilitation center. Recovery from a concussion can take time. How fast you recover depends on many factors. Return to activities only when:  Your symptoms are completely gone.  Your health care provider says that it is safe. Follow these instructions at home: Activity  Limit activities that require a lot of thought or concentration, such as: ? Doing homework or job-related work. ? Watching TV. ? Working on the computer or phone. ? Playing  memory games and puzzles.  Rest. Rest helps your brain heal. Make sure you: ? Get plenty of sleep. Most adults should get 7-9 hours of sleep each night. ? Rest during the day. Take naps or rest breaks when you feel tired.  Avoid physical activity like exercise until your health care provider says it is safe. Stop any activity that worsens symptoms.  Do not do high-risk activities that could cause a second concussion, such as riding a bike or playing sports.  Ask your health care provider when you can return to your normal activities, such as school, work, athletics, and driving. Your ability to react may be slower after a brain injury. Never do these activities if you are dizzy. Your health care provider will likely give you a plan for gradually returning to activities. General instructions   Take over-the-counter and prescription medicines only as told by your health care provider. Some medicines, such as blood thinners (anticoagulants) and aspirin, may increase the risk for complications, such as bleeding.  Do not drink alcohol until your health care provider says you can.  Watch your symptoms and tell others around you to do the same.  Complications sometimes occur after a concussion. Older adults with a brain injury may have a higher risk of serious complications.  Tell your work Freight forwarder, teachers, Government social research officer, school counselor, coach, or Product/process development scientist about your injury, symptoms, and restrictions.  Keep all follow-up visits as told by your health care provider. This is important. How is this prevented? Avoiding another brain injury is very important. In rare cases, another injury can lead to permanent brain damage, brain swelling, or death. The risk of this is greatest during the first 7-10 days after a head injury. Avoid injuries by:  Stopping activities that could lead to a second concussion, such as contact or recreational sports, until your health care provider says it is  okay.  Taking these actions once you have returned to sports or activities: ? Avoiding plays or moves that can cause you to crash into another person. This is how most concussions occur. ? Following the rules and being respectful of other players. Do not engage in violent or illegal plays.  Getting regular exercise that includes strength and balance training.  Wearing a properly fitting helmet during sports, biking, or other activities. Helmets can help protect you from serious skull and brain injuries, but they do not protect you from a concussion. Even when wearing a helmet, you should avoid being hit in the head. Contact a health care provider if:  Your symptoms get worse or they do not improve.  You have new symptoms.  You have another injury. Get help right away if:  You have severe or worsening headaches.  You have weakness or numbness in any part of your body.  You are confused.  Your coordination gets worse.  You vomit repeatedly.  You are sleepier than normal.  Your speech is slurred.  You cannot recognize people or places.  You have a seizure.  It is difficult to wake you up.  You have unusual behavior changes.  You have changes in your vision.  You lose consciousness. Summary  A concussion is a brain injury that results from a hard, direct hit (trauma) to your head or body.  You may have imaging tests and neuropsychological tests to diagnose a concussion.  Treatment for this condition includes physical and mental rest and careful observation.  Ask your health care provider when you can return to your normal activities, such as school, work, athletics, and driving.  Get help right away if you have a severe headache, weakness on one side of the body, seizures, behavior changes, changes in vision, or if you are confused or sleepier than normal. This information is not intended to replace advice given to you by your health care provider. Make sure you  discuss any questions you have with your health care provider. Document Revised: 09/24/2017 Document Reviewed: 09/24/2017 Elsevier Patient Education  [place] or Strain Rehab Ask your health care provider which exercises are safe for you. Do exercises exactly as told by your health care provider and adjust them as directed. It is normal to feel mild stretching, pulling, tightness, or discomfort as you do these exercises. Stop right away if you feel sudden pain or your pain gets worse. Do not begin these exercises until told by your health care provider. Stretching and range-of-motion exercises These exercises warm up your muscles and joints and improve the movement and flexibility of your back. These exercises also help to relieve pain, numbness, and tingling. Lumbar rotation  1. Lie on your back on a  firm surface and bend your knees. 2. Straighten your arms out to your sides so each arm forms a 90-degree angle (right angle) with a side of your body. 3. Slowly move (rotate) both of your knees to one side of your body until you feel a stretch in your lower back (lumbar). Try not to let your shoulders lift off the floor. 4. Hold this position for __________ seconds. 5. Tense your abdominal muscles and slowly move your knees back to the starting position. 6. Repeat this exercise on the other side of your body. Repeat __________ times. Complete this exercise __________ times a day. Single knee to chest  1. Lie on your back on a firm surface with both legs straight. 2. Bend one of your knees. Use your hands to move your knee up toward your chest until you feel a gentle stretch in your lower back and buttock. ? Hold your leg in this position by holding on to the front of your knee. ? Keep your other leg as straight as possible. 3. Hold this position for __________ seconds. 4. Slowly return to the starting position. 5. Repeat with your other leg. Repeat __________  times. Complete this exercise __________ times a day. Prone extension on elbows  1. Lie on your abdomen on a firm surface (prone position). 2. Prop yourself up on your elbows. 3. Use your arms to help lift your chest up until you feel a gentle stretch in your abdomen and your lower back. ? This will place some of your body weight on your elbows. If this is uncomfortable, try stacking pillows under your chest. ? Your hips should stay down, against the surface that you are lying on. Keep your hip and back muscles relaxed. 4. Hold this position for __________ seconds. 5. Slowly relax your upper body and return to the starting position. Repeat __________ times. Complete this exercise __________ times a day. Strengthening exercises These exercises build strength and endurance in your back. Endurance is the ability to use your muscles for a long time, even after they get tired. Pelvic tilt This exercise strengthens the muscles that lie deep in the abdomen. 1. Lie on your back on a firm surface. Bend your knees and keep your feet flat on the floor. 2. Tense your abdominal muscles. Tip your pelvis up toward the ceiling and flatten your lower back into the floor. ? To help with this exercise, you may place a small towel under your lower back and try to push your back into the towel. 3. Hold this position for __________ seconds. 4. Let your muscles relax completely before you repeat this exercise. Repeat __________ times. Complete this exercise __________ times a day. Alternating arm and leg raises  1. Get on your hands and knees on a firm surface. If you are on a hard floor, you may want to use padding, such as an exercise mat, to cushion your knees. 2. Line up your arms and legs. Your hands should be directly below your shoulders, and your knees should be directly below your hips. 3. Lift your left leg behind you. At the same time, raise your right arm and straighten it in front of you. ? Do not  lift your leg higher than your hip. ? Do not lift your arm higher than your shoulder. ? Keep your abdominal and back muscles tight. ? Keep your hips facing the ground. ? Do not arch your back. ? Keep your balance carefully, and do not hold your breath. 4.  Hold this position for __________ seconds. 5. Slowly return to the starting position. 6. Repeat with your right leg and your left arm. Repeat __________ times. Complete this exercise __________ times a day. Abdominal set with straight leg raise  1. Lie on your back on a firm surface. 2. Bend one of your knees and keep your other leg straight. 3. Tense your abdominal muscles and lift your straight leg up, 4-6 inches (10-15 cm) off the ground. 4. Keep your abdominal muscles tight and hold this position for __________ seconds. ? Do not hold your breath. ? Do not arch your back. Keep it flat against the ground. 5. Keep your abdominal muscles tense as you slowly lower your leg back to the starting position. 6. Repeat with your other leg. Repeat __________ times. Complete this exercise __________ times a day. Single leg lower with bent knees 1. Lie on your back on a firm surface. 2. Tense your abdominal muscles and lift your feet off the floor, one foot at a time, so your knees and hips are bent in 90-degree angles (right angles). ? Your knees should be over your hips and your lower legs should be parallel to the floor. 3. Keeping your abdominal muscles tense and your knee bent, slowly lower one of your legs so your toe touches the ground. 4. Lift your leg back up to return to the starting position. ? Do not hold your breath. ? Do not let your back arch. Keep your back flat against the ground. 5. Repeat with your other leg. Repeat __________ times. Complete this exercise __________ times a day. Posture and body mechanics Good posture and healthy body mechanics can help to relieve stress in your body's tissues and joints. Body mechanics  refers to the movements and positions of your body while you do your daily activities. Posture is part of body mechanics. Good posture means:  Your spine is in its natural S-curve position (neutral).  Your shoulders are pulled back slightly.  Your head is not tipped forward. Follow these guidelines to improve your posture and body mechanics in your everyday activities. Standing   When standing, keep your spine neutral and your feet about hip width apart. Keep a slight bend in your knees. Your ears, shoulders, and hips should line up.  When you do a task in which you stand in one place for a long time, place one foot up on a stable object that is 2-4 inches (5-10 cm) high, such as a footstool. This helps keep your spine neutral. Sitting   When sitting, keep your spine neutral and keep your feet flat on the floor. Use a footrest, if necessary, and keep your thighs parallel to the floor. Avoid rounding your shoulders, and avoid tilting your head forward.  When working at a desk or a computer, keep your desk at a height where your hands are slightly lower than your elbows. Slide your chair under your desk so you are close enough to maintain good posture.  When working at a computer, place your monitor at a height where you are looking straight ahead and you do not have to tilt your head forward or downward to look at the screen. Resting  When lying down and resting, avoid positions that are most painful for you.  If you have pain with activities such as sitting, bending, stooping, or squatting, lie in a position in which your body does not bend very much. For example, avoid curling up on your side with your  arms and knees near your chest (fetal position).  If you have pain with activities such as standing for a long time or reaching with your arms, lie with your spine in a neutral position and bend your knees slightly. Try the following positions: ? Lying on your side with a pillow between  your knees. ? Lying on your back with a pillow under your knees. Lifting   When lifting objects, keep your feet at least shoulder width apart and tighten your abdominal muscles.  Bend your knees and hips and keep your spine neutral. It is important to lift using the strength of your legs, not your back. Do not lock your knees straight out.  Always ask for help to lift heavy or awkward objects. This information is not intended to replace advice given to you by your health care provider. Make sure you discuss any questions you have with your health care provider. Document Revised: 05/28/2018 Document Reviewed: 02/25/2018 Elsevier Patient Education  [place].  Neck Exercises Ask your health care provider which exercises are safe for you. Do exercises exactly as told by your health care provider and adjust them as directed. It is normal to feel mild stretching, pulling, tightness, or discomfort as you do these exercises. Stop right away if you feel sudden pain or your pain gets worse. Do not begin these exercises until told by your health care provider. Neck exercises can be important for many reasons. They can improve strength and maintain flexibility in your neck, which will help your upper back and prevent neck pain. Stretching exercises Rotation neck stretching  1. Sit in a chair or stand up. 2. Place your feet flat on the floor, shoulder width apart. 3. Slowly turn your head (rotate) to the right until a slight stretch is felt. Turn it all the way to the right so you can look over your right shoulder. Do not tilt or tip your head. 4. Hold this position for 10-30 seconds. 5. Slowly turn your head (rotate) to the left until a slight stretch is felt. Turn it all the way to the left so you can look over your left shoulder. Do not tilt or tip your head. 6. Hold this position for 10-30 seconds. Repeat __________ times. Complete this exercise __________ times a day. Neck  retraction 1. Sit in a sturdy chair or stand up. 2. Look straight ahead. Do not bend your neck. 3. Use your fingers to push your chin backward (retraction). Do not bend your neck for this movement. Continue to face straight ahead. If you are doing the exercise properly, you will feel a slight sensation in your throat and a stretch at the back of your neck. 4. Hold the stretch for 1-2 seconds. Repeat __________ times. Complete this exercise __________ times a day. Strengthening exercises Neck press 1. Lie on your back on a firm bed or on the floor with a pillow under your head. 2. Use your neck muscles to push your head down on the pillow and straighten your spine. 3. Hold the position as well as you can. Keep your head facing up (in a neutral position) and your chin tucked. 4. Slowly count to 5 while holding this position. Repeat __________ times. Complete this exercise __________ times a day. Isometrics These are exercises in which you strengthen the muscles in your neck while keeping your neck still (isometrics). 1. Sit in a supportive chair and place your hand on your forehead. 2. Keep your head and face facing  straight ahead. Do not flex or extend your neck while doing isometrics. 3. Push forward with your head and neck while pushing back with your hand. Hold for 10 seconds. 4. Do the sequence again, this time putting your hand against the back of your head. Use your head and neck to push backward against the hand pressure. 5. Finally, do the same exercise on either side of your head, pushing sideways against the pressure of your hand. Repeat __________ times. Complete this exercise __________ times a day. Prone head lifts 1. Lie face-down (prone position), resting on your elbows so that your chest and upper back are raised. 2. Start with your head facing downward, near your chest. Position your chin either on or near your chest. 3. Slowly lift your head upward. Lift until you are looking  straight ahead. Then continue lifting your head as far back as you can comfortably stretch. 4. Hold your head up for 5 seconds. Then slowly lower it to your starting position. Repeat __________ times. Complete this exercise __________ times a day. Supine head lifts 1. Lie on your back (supine position), bending your knees to point to the ceiling and keeping your feet flat on the floor. 2. Lift your head slowly off the floor, raising your chin toward your chest. 3. Hold for 5 seconds. Repeat __________ times. Complete this exercise __________ times a day. Scapular retraction 1. Stand with your arms at your sides. Look straight ahead. 2. Slowly pull both shoulders (scapulae) backward and downward (retraction) until you feel a stretch between your shoulder blades in your upper back. 3. Hold for 10-30 seconds. 4. Relax and repeat. Repeat __________ times. Complete this exercise __________ times a day. Contact a health care provider if:  Your neck pain or discomfort gets much worse when you do an exercise.  Your neck pain or discomfort does not improve within 2 hours after you exercise. If you have any of these problems, stop exercising right away. Do not do the exercises again unless your health care provider says that you can. Get help right away if:  You develop sudden, severe neck pain. If this happens, stop exercising right away. Do not do the exercises again unless your health care provider says that you can. This information is not intended to replace advice given to you by your health care provider. Make sure you discuss any questions you have with your health care provider. Document Revised: 12/02/2017 Document Reviewed: 12/02/2017 Elsevier Patient Education  [place].
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
## 2019-11-18 ENCOUNTER — Ambulatory Visit: Payer: No Typology Code available for payment source | Admitting: Internal Medicine

## 2020-01-30 ENCOUNTER — Other Ambulatory Visit: Payer: Self-pay | Admitting: Internal Medicine

## 2020-03-01 ENCOUNTER — Other Ambulatory Visit: Payer: No Typology Code available for payment source

## 2020-03-01 DIAGNOSIS — Z20822 Contact with and (suspected) exposure to covid-19: Secondary | ICD-10-CM

## 2020-03-04 LAB — NOVEL CORONAVIRUS, NAA: SARS-CoV-2, NAA: NOT DETECTED

## 2020-03-06 ENCOUNTER — Other Ambulatory Visit: Payer: Self-pay

## 2020-03-06 ENCOUNTER — Encounter: Payer: No Typology Code available for payment source | Admitting: Internal Medicine

## 2020-03-07 ENCOUNTER — Encounter: Payer: Self-pay | Admitting: Internal Medicine

## 2020-03-07 ENCOUNTER — Ambulatory Visit (INDEPENDENT_AMBULATORY_CARE_PROVIDER_SITE_OTHER): Payer: No Typology Code available for payment source | Admitting: Internal Medicine

## 2020-03-07 VITALS — BP 101/70 | HR 75 | Temp 97.8°F | Resp 14 | Ht 63.0 in | Wt 167.0 lb

## 2020-03-07 DIAGNOSIS — Z0000 Encounter for general adult medical examination without abnormal findings: Secondary | ICD-10-CM

## 2020-03-07 DIAGNOSIS — E559 Vitamin D deficiency, unspecified: Secondary | ICD-10-CM

## 2020-03-07 DIAGNOSIS — R921 Mammographic calcification found on diagnostic imaging of breast: Secondary | ICD-10-CM

## 2020-03-07 DIAGNOSIS — E782 Mixed hyperlipidemia: Secondary | ICD-10-CM | POA: Diagnosis not present

## 2020-03-07 DIAGNOSIS — Z1231 Encounter for screening mammogram for malignant neoplasm of breast: Secondary | ICD-10-CM

## 2020-03-07 DIAGNOSIS — Z113 Encounter for screening for infections with a predominantly sexual mode of transmission: Secondary | ICD-10-CM

## 2020-03-07 DIAGNOSIS — Z1211 Encounter for screening for malignant neoplasm of colon: Secondary | ICD-10-CM

## 2020-03-07 DIAGNOSIS — E663 Overweight: Secondary | ICD-10-CM

## 2020-03-07 NOTE — Patient Instructions (Signed)
Congratulations on the weight loss!!!   Your annual mammogram has been ordered.  You are encouraged (required) to call to make your appointment at [facility]   I will initiate the order for your colon  cancer screening  Test.  It is called  Cologuard.  It will be delivered to your house, and you will send off a stool sample in the envelope it provides.     Health Maintenance, Female Adopting a healthy lifestyle and getting preventive care are important in promoting health and wellness. Ask your health care provider about:  The right schedule for you to have regular tests and exams.  Things you can do on your own to prevent diseases and keep yourself healthy. What should I know about diet, weight, and exercise? Eat a healthy diet  Eat a diet that includes plenty of vegetables, fruits, low-fat dairy products, and lean protein.  Do not eat a lot of foods that are high in solid fats, added sugars, or sodium.   Maintain a healthy weight Body mass index (BMI) is used to identify weight problems. It estimates body fat based on height and weight. Your health care provider can help determine your BMI and help you achieve or maintain a healthy weight. Get regular exercise Get regular exercise. This is one of the most important things you can do for your health. Most adults should:  Exercise for at least 150 minutes each week. The exercise should increase your heart rate and make you sweat (moderate-intensity exercise).  Do strengthening exercises at least twice a week. This is in addition to the moderate-intensity exercise.  Spend less time sitting. Even light physical activity can be beneficial. Watch cholesterol and blood lipids Have your blood tested for lipids and cholesterol at 48 years of age, then have this test every 5 years. Have your cholesterol levels checked more often if:  Your lipid or cholesterol levels are high.  You are older than 48 years of age.  You are at  high risk for heart disease. What should I know about cancer screening? Depending on your health history and family history, you may need to have cancer screening at various ages. This may include screening for:  Breast cancer.  Cervical cancer.  Colorectal cancer.  Skin cancer.  Lung cancer. What should I know about heart disease, diabetes, and high blood pressure? Blood pressure and heart disease  High blood pressure causes heart disease and increases the risk of stroke. This is more likely to develop in people who have high blood pressure readings, are of African descent, or are overweight.  Have your blood pressure checked: ? Every 3-5 years if you are 30-49 years of age. ? Every year if you are 81 years old or older. Diabetes Have regular diabetes screenings. This checks your fasting blood sugar level. Have the screening done:  Once every three years after age 62 if you are at a normal weight and have a low risk for diabetes.  More often and at a younger age if you are overweight or have a high risk for diabetes. What should I know about preventing infection? Hepatitis B If you have a higher risk for hepatitis B, you should be screened for this virus. Talk with your health care provider to find out if you are at risk for hepatitis B infection. Hepatitis C Testing is recommended for:  Everyone born from [birth year] through 1963.  Anyone with known risk factors for hepatitis C. Sexually transmitted infections (STIs)  Get screened for STIs, including gonorrhea and chlamydia, if: ? You are sexually active and are younger than 48 years of age. ? You are older than 48 years of age and your health care provider tells you that you are at risk for this type of infection. ? Your sexual activity has changed since you were last screened, and you are at increased risk for chlamydia or gonorrhea. Ask your health care provider if you are at risk.  Ask your health care provider about whether  you are at high risk for HIV. Your health care provider may recommend a prescription medicine to help prevent HIV infection. If you choose to take medicine to prevent HIV, you should first get tested for HIV. You should then be tested every 3 months for as long as you are taking the medicine. Pregnancy  If you are about to stop having your period (premenopausal) and you may become pregnant, seek counseling before you get pregnant.  Take 400 to 800 micrograms (mcg) of folic acid every day if you become pregnant.  Ask for birth control (contraception) if you want to prevent pregnancy. Osteoporosis and menopause Osteoporosis is a disease in which the bones lose minerals and strength with aging. This can result in bone fractures. If you are 59 years old or older, or if you are at risk for osteoporosis and fractures, ask your health care provider if you should:  Be screened for bone loss.  Take a calcium or vitamin D supplement to lower your risk of fractures.  Be given hormone replacement therapy (HRT) to treat symptoms of menopause. Follow these instructions at home: Lifestyle  Do not use any products that contain nicotine or tobacco, such as cigarettes, e-cigarettes, and chewing tobacco. If you need help quitting, ask your health care provider.  Do not use street drugs.  Do not share needles.  Ask your health care provider for help if you need support or information about quitting drugs. Alcohol use  Do not drink alcohol if: ? Your health care provider tells you not to drink. ? You are pregnant, may be pregnant, or are planning to become pregnant.  If you drink alcohol: ? Limit how much you use to 0-1 drink a day. ? Limit intake if you are breastfeeding.  Be aware of how much alcohol is in your drink. In the [place] one drink equals one 12 oz bottle of beer (355 mL), one 5 oz glass of wine (148 mL), or one 1 oz glass of hard liquor (44 mL). General instructions  Schedule regular  health, dental, and eye exams.  Stay current with your vaccines.  Tell your health care provider if: ? You often feel depressed. ? You have ever been abused or do not feel safe at home. Summary  Adopting a healthy lifestyle and getting preventive care are important in promoting health and wellness.  Follow your health care provider's instructions about healthy diet, exercising, and getting tested or screened for diseases.  Follow your health care provider's instructions on monitoring your cholesterol and blood pressure. This information is not intended to replace advice given to you by your health care provider. Make sure you discuss any questions you have with your health care provider. Document Revised: 01/27/2018 Document Reviewed: 01/27/2018 Elsevier Patient Education  2021 [employer].
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
## 2020-03-08 LAB — COMPREHENSIVE METABOLIC PANEL
ALT: 10 U/L (ref 0–35)
AST: 13 U/L (ref 0–37)
Albumin: 4.4 g/dL (ref 3.5–5.2)
Alkaline Phosphatase: 40 U/L (ref 39–117)
BUN: 19 mg/dL (ref 6–23)
CO2: 25 mEq/L (ref 19–32)
Calcium: 9.3 mg/dL (ref 8.4–10.5)
Chloride: 104 mEq/L (ref 96–112)
Creatinine, Ser: 0.91 mg/dL (ref 0.40–1.20)
GFR: 75.09 mL/min (ref >60.00)
Glucose, Bld: 81 mg/dL (ref 70–99)
Potassium: 4.2 mEq/L (ref 3.5–5.1)
Sodium: 136 mEq/L (ref 135–145)
Total Bilirubin: 0.5 mg/dL (ref 0.2–1.2)
Total Protein: 6.7 g/dL (ref 6.0–8.3)

## 2020-03-08 LAB — VITAMIN D 25 HYDROXY (VIT D DEFICIENCY, FRACTURES): VITD: 62.98 ng/mL (ref 30.00–100.00)

## 2020-03-08 LAB — LIPID PANEL
Cholesterol: 188 mg/dL (ref 0–200)
HDL: 42.3 mg/dL (ref >39.00)
LDL Cholesterol: 126 mg/dL — ABNORMAL HIGH (ref 0–99)
NonHDL: 146.12
Total CHOL/HDL Ratio: 4
Triglycerides: 100 mg/dL (ref 0.0–149.0)
VLDL: 20 mg/dL (ref 0.0–40.0)

## 2020-03-08 LAB — TSH: TSH: 1.28 u[IU]/mL (ref 0.35–4.50)

## 2020-03-08 LAB — HEMOGLOBIN A1C: Hgb A1c MFr Bld: 5.6 % (ref 4.6–6.5)

## 2020-03-08 LAB — HEPATITIS A ANTIBODY, TOTAL: Hepatitis A AB,Total: REACTIVE — AB

## 2020-03-08 LAB — HEPATITIS B SURFACE ANTIBODY,QUALITATIVE: Hep B S Ab: REACTIVE — AB

## 2020-03-08 NOTE — Assessment & Plan Note (Signed)
I have congratulated her in reduction of   BMI and encouraged  Continued weight loss with goal of 10% of body weigh over the next 6 months using the [place] and regular exercise a minimum of 5 days per week.

## 2020-03-08 NOTE — Assessment & Plan Note (Signed)

## 2020-03-19 ENCOUNTER — Encounter: Payer: Self-pay | Admitting: Internal Medicine

## 2020-04-05 LAB — COLOGUARD
COLOGUARD: NEGATIVE
Cologuard: NEGATIVE

## 2020-04-05 LAB — EXTERNAL GENERIC LAB PROCEDURE: COLOGUARD: NEGATIVE

## 2020-04-15 ENCOUNTER — Telehealth: Payer: Self-pay | Admitting: Internal Medicine

## 2020-04-15 LAB — COLOGUARD: Cologuard: NEGATIVE

## 2020-04-15 NOTE — Telephone Encounter (Signed)
Negative cologuard

## 2020-05-23 ENCOUNTER — Other Ambulatory Visit: Payer: Self-pay

## 2020-05-23 ENCOUNTER — Ambulatory Visit
Admission: RE | Admit: 2020-05-23 | Discharge: 2020-05-23 | Disposition: A | Discharge status: Home / Self Care | Payer: No Typology Code available for payment source | Source: Ambulatory Visit | Attending: Internal Medicine | Admitting: Internal Medicine

## 2020-05-23 DIAGNOSIS — Z1231 Encounter for screening mammogram for malignant neoplasm of breast: Secondary | ICD-10-CM | POA: Diagnosis present

## 2020-07-08 ENCOUNTER — Other Ambulatory Visit: Payer: Self-pay | Admitting: Internal Medicine

## 2020-07-08 MED ORDER — DOXYCYCLINE HYCLATE 100 MG PO TABS: 100.0000 mg | ORAL_TABLET | Freq: Two times a day (BID) | ORAL | 0 refills | Status: DC

## 2020-07-23 LAB — SURGICAL PATHOLOGY

## 2020-08-06 ENCOUNTER — Emergency Department
Admission: EM | Admit: 2020-08-06 | Discharge: 2020-08-07 | Disposition: A | Discharge status: Home / Self Care | Payer: No Typology Code available for payment source | Attending: Emergency Medicine | Admitting: Emergency Medicine

## 2020-08-06 ENCOUNTER — Other Ambulatory Visit: Payer: Self-pay

## 2020-08-06 DIAGNOSIS — R5383 Other fatigue: Secondary | ICD-10-CM | POA: Diagnosis not present

## 2020-08-06 DIAGNOSIS — Z79899 Other long term (current) drug therapy: Secondary | ICD-10-CM | POA: Diagnosis not present

## 2020-08-06 DIAGNOSIS — R197 Diarrhea, unspecified: Secondary | ICD-10-CM | POA: Insufficient documentation

## 2020-08-06 DIAGNOSIS — I509 Heart failure, unspecified: Secondary | ICD-10-CM | POA: Diagnosis not present

## 2020-08-06 DIAGNOSIS — Z7982 Long term (current) use of aspirin: Secondary | ICD-10-CM | POA: Insufficient documentation

## 2020-08-06 LAB — COMPREHENSIVE METABOLIC PANEL
ALT: 14 U/L (ref 0–44)
AST: 19 U/L (ref 15–41)
Albumin: 4.1 g/dL (ref 3.5–5.0)
Alkaline Phosphatase: 56 U/L (ref 38–126)
Anion gap: 7 (ref 5–15)
BUN: 15 mg/dL (ref 6–20)
CO2: 23 mmol/L (ref 22–32)
Calcium: 8.5 mg/dL — ABNORMAL LOW (ref 8.9–10.3)
Chloride: 105 mmol/L (ref 98–111)
Creatinine, Ser: 0.91 mg/dL (ref 0.44–1.00)
GFR, Estimated: >60 mL/min (ref >60)
Glucose, Bld: 125 mg/dL — ABNORMAL HIGH (ref 70–99)
Potassium: 3.5 mmol/L (ref 3.5–5.1)
Sodium: 135 mmol/L (ref 135–145)
Total Bilirubin: 0.8 mg/dL (ref 0.3–1.2)
Total Protein: 7.1 g/dL (ref 6.5–8.1)

## 2020-08-06 LAB — URINALYSIS, COMPLETE (UACMP) WITH MICROSCOPIC
Bacteria, UA: NONE SEEN
Bilirubin Urine: NEGATIVE
Glucose, UA: NEGATIVE mg/dL
Ketones, ur: 5 mg/dL — AB
Nitrite: NEGATIVE
Protein, ur: 30 mg/dL — AB
Specific Gravity, Urine: 1.028 (ref 1.005–1.030)
pH: 5 (ref 5.0–8.0)

## 2020-08-06 LAB — LIPASE, BLOOD: Lipase: 26 U/L (ref 11–51)

## 2020-08-06 LAB — CBC
HCT: 39.9 % (ref 36.0–46.0)
Hemoglobin: 13.3 g/dL (ref 12.0–15.0)
MCH: 30.6 pg (ref 26.0–34.0)
MCHC: 33.3 g/dL (ref 30.0–36.0)
MCV: 91.9 fL (ref 80.0–100.0)
Platelets: 274 10*3/uL (ref 150–400)
RBC: 4.34 MIL/uL (ref 3.87–5.11)
RDW: 13.2 % (ref 11.5–15.5)
WBC: 10.5 10*3/uL (ref 4.0–10.5)
nRBC: 0 % (ref 0.0–0.2)

## 2020-08-06 LAB — POC URINE PREG, ED: Preg Test, Ur: NEGATIVE

## 2020-08-06 LAB — MAGNESIUM: Magnesium: 2 mg/dL (ref 1.7–2.4)

## 2020-08-06 MED ORDER — DICYCLOMINE HCL 10 MG PO CAPS
20.0000 mg | ORAL_CAPSULE | Freq: Once | ORAL | Status: AC
  Administered 2020-08-06: 20 mg via ORAL
  Filled 2020-08-06: qty 2

## 2020-08-06 MED ORDER — DICYCLOMINE HCL 10 MG PO CAPS: 10.0000 mg | ORAL_CAPSULE | Freq: Three times a day (TID) | ORAL | 0 refills | Status: DC

## 2020-08-06 MED ORDER — LOPERAMIDE HCL 2 MG PO CAPS
4.0000 mg | ORAL_CAPSULE | Freq: Once | ORAL | Status: AC
  Administered 2020-08-06: 4 mg via ORAL
  Filled 2020-08-06: qty 2

## 2020-08-06 MED ORDER — DIPHENOXYLATE-ATROPINE 2.5-0.025 MG PO TABS: 1.0000 | ORAL_TABLET | Freq: Four times a day (QID) | ORAL | 0 refills | Status: AC | PRN

## 2020-08-06 MED ORDER — LACTATED RINGERS IV BOLUS
1000.0000 mL | Freq: Once | INTRAVENOUS | Status: AC
  Administered 2020-08-06: 1000 mL via INTRAVENOUS

## 2020-08-06 MED ORDER — POTASSIUM CHLORIDE CRYS ER 20 MEQ PO TBCR
40.0000 meq | EXTENDED_RELEASE_TABLET | Freq: Once | ORAL | Status: AC
  Administered 2020-08-06: 40 meq via ORAL
  Filled 2020-08-06: qty 2

## 2020-08-06 NOTE — ED Provider Notes (Signed)
[facility] Emergency Department Provider Note  ____________________________________________   Event Date/Time   First MD Initiated Contact with Patient 08/06/20 2136     (approximate)  I have reviewed the triage vital signs and the nursing notes.   HISTORY  Chief Complaint Diarrhea    HPI Alyssa Melendez is a 50 y.o. female with past medical history of long QT syndrome status post AICD placement, here with diarrhea and mild fatigue.  The patient states that for the last several days, she has had constant, watery, profuse diarrhea.  She has tried Imodium without significant improvement.  Denies any preceding sick contacts or suspicious food intake.  She has tried symptomatic control as mentioned without significant relief.  Her diarrhea has been watery, nonbloody.  No recent antibiotic use.  She does note a tick bite several months ago, but denied any rash or other symptoms with it.  No history of GI issues.    Past Medical History:  Diagnosis Date   Barlow syndrome    Complex ovarian cyst    Congestive heart failure ([place])    Fibroadenoma    Hx-sudden cardiac arrest 2009   secondary to Long QT Syndrome   Increased BMI    LLQ pain    Long Q-T syndrome 2009   with Sudden Cardiac arrest   Mitral valve prolapse    OAB (overactive bladder)     Patient Active Problem List   Diagnosis Date Noted   Acute pain of left knee 10/02/2017   Herpes zoster 01/18/2016   AICD (automatic cardioverter/defibrillator) present 12/20/2015   Pes anserine bursitis 06/18/2015   Gastrocnemius muscle tear 05/30/2015   Vitamin D deficiency 11/29/2014   Hyperlipidemia 04/11/2014   Overweight 04/11/2014   Carpal tunnel syndrome 11/02/2013   Breast calcifications on mammogram 09/21/2013   Routine general medical examination at a health care facility 02/22/2013   Plantar fasciitis, bilateral 02/21/2013   Cardiac arrest ([place]) 11/22/2012   Hirsutism 02/16/2012   Hx-sudden  cardiac arrest    Long Q-T syndrome    Mitral valve prolapse     Past Surgical History:  Procedure Laterality Date   BREAST BIOPSY Left    Negative 10+years ago   BREAST EXCISIONAL BIOPSY Left    fibroadenoma over 10 years ago   [place]  2009   CARDIAC DEFIBRILLATOR PLACEMENT  June 2013   CESAREAN SECTION  2007 and 2005   MITRAL VALVE REPAIR      Prior to Admission medications   Medication Sig Start Date End Date Taking? Authorizing Provider  dicyclomine (BENTYL) 10 MG capsule Take 1 capsule (10 mg total) by mouth 4 (four) times daily -  before meals and at bedtime for 3 days. 08/06/20 08/09/20 Yes Duffy Bruce, MD  diphenoxylate-atropine (LOMOTIL) 2.5-0.025 MG tablet Take 1 tablet by mouth 4 (four) times daily as needed for up to 5 days for diarrhea or loose stools. 08/06/20 08/11/20 Yes Duffy Bruce, MD  ALPRAZolam Duanne Moron) 0.25 MG tablet Take 1 tablet (0.25 mg total) by mouth at bedtime as needed for anxiety. 03/18/18   Crecencio Mc, MD  aspirin 81 MG tablet Take 81 mg by mouth daily.    [provider]  atenolol (TENORMIN) 25 MG tablet Take 25 mg by mouth daily.    [provider]  Cholecalciferol 2000 UNITS CAPS Take 1 capsule by mouth daily.    [provider]  doxycycline (VIBRA-TABS) 100 MG  tablet Take 1 tablet (100 mg total) by mouth 2 (two) times daily. 07/08/20   Crecencio Mc, MD    Allergies Morphine and related  Family History  Problem Relation Age of Onset   Hyperlipidemia Mother    Cancer Mother 65       melanoma   Arthritis Mother        bilateral total knee replacements   Heart disease Father        CABG x 3    Mental illness Father        Alzheimers Dementia   Hyperlipidemia Father    Stroke Father        ich   Breast cancer Neg Hx    Ovarian cancer Neg Hx    Colon cancer Neg Hx     Social History Social History   Tobacco Use   Smoking status: Never    Smokeless tobacco: Never  Vaping Use   Vaping Use: Never used  Substance Use Topics   Alcohol use: Yes    Comment: occassionally   Drug use: No    Review of Systems  Review of Systems  Constitutional:  Positive for fatigue. Negative for fever.  HENT:  Negative for congestion and sore throat.   Eyes:  Negative for visual disturbance.  Respiratory:  Negative for cough and shortness of breath.   Cardiovascular:  Negative for chest pain.  Gastrointestinal:  Positive for diarrhea. Negative for abdominal pain, nausea and vomiting.  Genitourinary:  Negative for flank pain.  Musculoskeletal:  Negative for back pain and neck pain.  Skin:  Negative for rash and wound.  Neurological:  Negative for weakness.  All other systems reviewed and are negative.   ____________________________________________  PHYSICAL EXAM:      VITAL SIGNS: ED Triage Vitals  Enc Vitals Group     BP 08/06/20 2058 118/86     Pulse Rate 08/06/20 2058 87     Resp 08/06/20 2058 16     Temp 08/06/20 2058 98.5 F (36.9 C)     Temp Source 08/06/20 2058 Oral     SpO2 08/06/20 2058 99 %     Weight 08/06/20 2057 160 lb (72.6 kg)     Height 08/06/20 2057 5' 3"  (1.6 m)     Head Circumference --      Peak Flow --      Pain Score 08/06/20 2056 7     Pain Loc --      Pain Edu? --      Excl. in [place]? --      Physical Exam Vitals and nursing note reviewed.  Constitutional:      General: She is not in acute distress.    Appearance: She is well-developed.  HENT:     Head: Normocephalic and atraumatic.     Mouth/Throat:     Mouth: Mucous membranes are dry.  Eyes:     Conjunctiva/sclera: Conjunctivae normal.  Cardiovascular:     Rate and Rhythm: Normal rate and regular rhythm.     Heart sounds: Normal heart sounds. No murmur heard.   No friction rub.  Pulmonary:     Effort: Pulmonary effort is normal. No respiratory distress.     Breath sounds: Normal breath sounds. No wheezing or rales.  Abdominal:      General: There is no distension.     Palpations: Abdomen is soft.     Tenderness: There is no abdominal tenderness.  Musculoskeletal:     Cervical back: Neck  supple.  Skin:    General: Skin is warm.     Capillary Refill: Capillary refill takes less than 2 seconds.  Neurological:     Mental Status: She is alert and oriented to person, place, and time.     Motor: No abnormal muscle tone.      ____________________________________________   LABS (all labs ordered are listed, but only abnormal results are displayed)  Labs Reviewed  COMPREHENSIVE METABOLIC PANEL - Abnormal; Notable for the following components:      Result Value   Glucose, Bld 125 (*)    Calcium 8.5 (*)    All other components within normal limits  URINALYSIS, COMPLETE (UACMP) WITH MICROSCOPIC - Abnormal; Notable for the following components:   Color, Urine YELLOW (*)    APPearance CLEAR (*)    Hgb urine dipstick LARGE (*)    Ketones, ur 5 (*)    Protein, ur 30 (*)    Leukocytes,Ua SMALL (*)    All other components within normal limits  GASTROINTESTINAL PANEL BY PCR, STOOL (REPLACES STOOL CULTURE)  C DIFFICILE QUICK SCREEN W PCR REFLEX    LIPASE, BLOOD  CBC  MAGNESIUM  POC URINE PREG, ED    ____________________________________________  EKG: Normal sinus rhythm, VR 70. PR 253, QRS 89, QTc 424. No acute ST elevations or depressions. No ischemia or infarct. ________________________________________  RADIOLOGY All imaging, including plain films, CT scans, and ultrasounds, independently reviewed by me, and interpretations confirmed via formal radiology reads.  ED MD interpretation:     Official radiology report(s): No results found.  ____________________________________________  PROCEDURES   Procedure(s) performed (including Critical Care):  Procedures  ____________________________________________  INITIAL IMPRESSION / MDM / [place] / ED COURSE  As part of my medical decision  making, I reviewed the following data within the [place] notes reviewed and incorporated, Old chart reviewed, Notes from prior ED visits, and [place] Controlled Substance Database       *Alyssa Melendez was evaluated in Emergency Department on 08/07/2020 for the symptoms described in the history of present illness. She was evaluated in the context of the global COVID-19 pandemic, which necessitated consideration that the patient might be at risk for infection with the SARS-CoV-2 virus that causes COVID-19. Institutional protocols and algorithms that pertain to the evaluation of patients at risk for COVID-19 are in a state of rapid change based on information released by regulatory bodies including the CDC and federal and state organizations. These policies and algorithms were followed during the patient's care in the ED.  Some ED evaluations and interventions may be delayed as a result of limited staffing during the pandemic.*     Medical Decision Making: Very well-appearing, pleasant 50 year old female here with profuse diarrhea.  Differential includes viral GI illness, foodborne illness, less likely but consideration of alpha gal in the setting of previous tick bite.  Patient has no history of IBS or diverticulitis or other intra-abdominal pathology.  Her abdomen is soft and nontender, with no focal tenderness to suggest appendicitis, cholecystitis, diverticulitis, or other indication for additional imaging.  She has no leukocytosis.  Lab work is overall very reassuring.  Potassium on the low end of normal, which given her history, will replace.  Otherwise, she was given fluids.  GI panel has been sent.  Will advise supportive care at home and good return precautions.  ____________________________________________  FINAL CLINICAL IMPRESSION(S) / ED DIAGNOSES  Final diagnoses:  Diarrhea of presumed infectious origin  MEDICATIONS GIVEN DURING THIS VISIT:  Medications   lactated ringers bolus 1,000 mL (1,000 mLs Intravenous New Bag/Given 08/06/20 2246)  potassium chloride SA (KLOR-CON) CR tablet 40 mEq (40 mEq Oral Given 08/06/20 2241)  dicyclomine (BENTYL) capsule 20 mg (20 mg Oral Given 08/06/20 2241)  loperamide (IMODIUM) capsule 4 mg (4 mg Oral Given 08/06/20 2241)     ED Discharge Orders          Ordered    diphenoxylate-atropine (LOMOTIL) 2.5-0.025 MG tablet  4 times daily PRN        08/06/20 2253    dicyclomine (BENTYL) 10 MG capsule  3 times daily before meals & bedtime        08/06/20 2253             Note:  This document was prepared using Dragon voice recognition software and may include unintentional dictation errors.   Duffy Bruce, MD 08/07/20 [phone]

## 2020-08-06 NOTE — Discharge Instructions (Signed)
Continue the imodium. You can take this up to 3-4 x daily for 3-4 days.  Take the Bentyl to help with spasms and motility. Take with meals and at night.  If Imodium is not helping, you can try Lomotil. This was sent to your pharmacy.  If symptoms do not resolve in the next 2-3 days, I'd recommend seeing Dr. Derrel Nip for follow-up additional blood tests and possible GI referral

## 2020-08-06 NOTE — ED Triage Notes (Signed)
Pt reporting diarrhea on an off for about a month, states constant diarrhea since Friday. Denies fevers.

## 2020-08-07 DIAGNOSIS — R197 Diarrhea, unspecified: Secondary | ICD-10-CM

## 2020-08-07 NOTE — ED Provider Notes (Signed)
-----------------------------------------   1:08 AM on 08/07/2020 -----------------------------------------   Patient unable to provide stool specimen.  Will discharge home with stool collection kit.  EKG demonstrates normal QTC.  Strict return precautions given.  Patient verbalizes understanding agrees with plan of care.   ED ECG REPORT I, Eboni Coval J, the attending physician, personally viewed and interpreted this ECG.   Date: 08/07/2020  EKG Time: 0050  Rate: 70  Rhythm: normal EKG, normal sinus rhythm  Axis: Normal  Intervals:none  ST&T Change: Nonspecific    Paulette Blanch, MD 08/07/20 [phone]

## 2020-08-08 ENCOUNTER — Other Ambulatory Visit
Admission: RE | Admit: 2020-08-08 | Discharge: 2020-08-08 | Disposition: A | Discharge status: Home / Self Care | Payer: No Typology Code available for payment source | Source: Ambulatory Visit | Attending: Internal Medicine | Admitting: Internal Medicine

## 2020-08-08 DIAGNOSIS — R197 Diarrhea, unspecified: Secondary | ICD-10-CM | POA: Insufficient documentation

## 2020-08-08 LAB — GASTROINTESTINAL PANEL BY PCR, STOOL (REPLACES STOOL CULTURE)

## 2020-08-08 LAB — C DIFFICILE QUICK SCREEN W PCR REFLEX
C Diff antigen: NEGATIVE
C Diff interpretation: NOT DETECTED
C Diff toxin: NEGATIVE

## 2020-08-08 LAB — LACTOFERRIN, FECAL, QUALITATIVE: Lactoferrin, Fecal, Qual: POSITIVE — AB

## 2020-08-08 NOTE — Telephone Encounter (Signed)
Pt called and just wanted to make sure that Dr Derrel Nip saw her MyChart message from yesterday.

## 2020-08-13 ENCOUNTER — Encounter: Payer: No Typology Code available for payment source | Admitting: Certified Nurse Midwife

## 2020-08-17 ENCOUNTER — Other Ambulatory Visit: Payer: Self-pay

## 2020-08-17 ENCOUNTER — Ambulatory Visit: Payer: No Typology Code available for payment source | Admitting: Internal Medicine

## 2020-08-17 ENCOUNTER — Encounter: Payer: Self-pay | Admitting: Internal Medicine

## 2020-08-17 VITALS — BP 98/76 | HR 73 | Temp 96.5°F | Resp 14 | Ht 63.0 in | Wt 164.0 lb

## 2020-08-17 DIAGNOSIS — R197 Diarrhea, unspecified: Secondary | ICD-10-CM | POA: Diagnosis not present

## 2020-08-17 LAB — CBC WITH DIFFERENTIAL/PLATELET
Basophils Absolute: 0.1 10*3/uL (ref 0.0–0.1)
Basophils Relative: 1.7 % (ref 0.0–3.0)
Eosinophils Absolute: 0.4 10*3/uL (ref 0.0–0.7)
Eosinophils Relative: 8.2 % — ABNORMAL HIGH (ref 0.0–5.0)
HCT: 39.5 % (ref 36.0–46.0)
Hemoglobin: 13.2 g/dL (ref 12.0–15.0)
Lymphocytes Relative: 22.3 % (ref 12.0–46.0)
Lymphs Abs: 1.1 10*3/uL (ref 0.7–4.0)
MCHC: 33.5 g/dL (ref 30.0–36.0)
MCV: 92.1 fl (ref 78.0–100.0)
Monocytes Absolute: 0.6 10*3/uL (ref 0.1–1.0)
Monocytes Relative: 11.9 % (ref 3.0–12.0)
Neutro Abs: 2.7 10*3/uL (ref 1.4–7.7)
Neutrophils Relative %: 55.9 % (ref 43.0–77.0)
Platelets: 287 10*3/uL (ref 150.0–400.0)
RBC: 4.29 Mil/uL (ref 3.87–5.11)
RDW: 14 % (ref 11.5–15.5)
WBC: 4.9 10*3/uL (ref 4.0–10.5)

## 2020-08-17 LAB — C-REACTIVE PROTEIN: CRP: <1 mg/dL (ref 0.5–20.0)

## 2020-08-17 LAB — MAGNESIUM: Magnesium: 1.9 mg/dL (ref 1.5–2.5)

## 2020-08-17 LAB — SEDIMENTATION RATE: Sed Rate: 1 mm/hr (ref 0–20)

## 2020-08-17 MED ORDER — CIPROFLOXACIN HCL 500 MG PO TABS: 500.0000 mg | ORAL_TABLET | Freq: Two times a day (BID) | ORAL | 0 refills | Status: AC

## 2020-08-17 NOTE — Progress Notes (Signed)
Subjective:  Patient ID: Alyssa Melendez, female    DOB: 1971-01-24  Age: 50 y.o. MRN: [id number]  CC: The encounter diagnosis was Diarrhea of presumed infectious origin.  HPI Alyssa Melendez presents for evaluation and treatment of diarrhea  This visit occurred during the SARS-CoV-2 public health emergency.  Safety protocols were in place, including screening questions prior to the visit, additional usage of staff PPE, and extensive cleaning of exam room while observing appropriate contact time as indicated for disinfecting solutions.    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   stools are described as watery with sediment .  And prior to 3 weeks ago had a week of intermittent loose stools ,  but for the last 16 days has been contintuous.  Anorexia,  3 lb wt loss.   Lots of gas  no fevers, body aches .  She has  2 egg laying [place] hens and a garden . No travel,  family members not sick.    ER visit reviewed.     Outpatient Medications Prior to Visit  Medication Sig Dispense Refill   ALPRAZolam (XANAX) 0.25 MG tablet Take 1 tablet (0.25 mg total) by mouth at bedtime as needed for anxiety. 30 tablet 5   aspirin 81 MG tablet Take 81 mg by mouth daily.     atenolol (TENORMIN) 25 MG tablet Take 25 mg by mouth daily.     Cholecalciferol 2000 UNITS CAPS Take 1 capsule by mouth daily.     diphenoxylate-atropine (LOMOTIL) 2.5-0.025 MG tablet Take 1 tablet by mouth 4 (four) times daily as needed for diarrhea or loose stools.     dicyclomine (BENTYL) 10 MG capsule Take 1 capsule (10 mg total) by mouth 4 (four) times daily -  before meals and at bedtime for 3 days. (Patient not taking: Reported on 08/17/2020) 12 capsule 0   doxycycline (VIBRA-TABS) 100 MG tablet Take 1 tablet (100 mg total) by mouth 2 (two) times daily. (Patient not taking: Reported on 08/17/2020) 20  tablet 0   No facility-administered medications prior to visit.    Review of Systems;  Patient denies headache, fevers, malaise, unintentional weight loss, skin rash, eye pain, sinus congestion and sinus pain, sore throat, dysphagia,  hemoptysis , cough, dyspnea, wheezing, chest pain, palpitations, orthopnea, edema, abdominal pain, nausea, melena, diarrhea, constipation, flank pain, dysuria, hematuria, urinary  Frequency, nocturia, numbness, tingling, seizures,  Focal weakness, Loss of consciousness,  Tremor, insomnia, depression, anxiety, and suicidal ideation.      Objective:  BP 98/76 (BP Location: Left Arm, Patient Position: Sitting, Cuff Size: Normal)   Pulse 73   Temp (!) 96.5 F (35.8 C) (Temporal)   Resp 14   Ht 5' 3"  (1.6 m)   Wt 164 lb (74.4 kg)   SpO2 98%   BMI 29.05 kg/m   BP Readings from Last 3 Encounters:  08/17/20 98/76  08/07/20 120/79  03/07/20 101/70    Wt Readings from Last 3 Encounters:  08/17/20 164 lb (74.4 kg)  08/06/20 160 lb (72.6 kg)  03/07/20 167 lb (75.8 kg)    General appearance: alert, cooperative and appears stated age Ears: normal TM's and external ear canals both ears Throat: lips, mucosa, and tongue normal; teeth and gums normal Neck: no adenopathy, no carotid bruit, supple, symmetrical, trachea midline and thyroid not enlarged, symmetric, no tenderness/mass/nodules Back: symmetric, no  curvature. ROM normal. No CVA tenderness. Lungs: clear to auscultation bilaterally Heart: regular rate and rhythm, S1, S2 normal, no murmur, click, rub or gallop Abdomen: soft, non-tender; bowel sounds normal; no masses,  no organomegaly Pulses: 2+ and symmetric Skin: Skin color, texture, turgor normal. No rashes or lesions Lymph nodes: Cervical, supraclavicular, and axillary nodes normal.  Lab Results  Component Value Date   HGBA1C 5.6 03/07/2020   HGBA1C 5.5 03/02/2018    Lab Results  Component Value Date   CREATININE 0.91 08/06/2020    CREATININE 0.91 03/07/2020   CREATININE 0.85 03/02/2018    Lab Results  Component Value Date   WBC 4.9 08/17/2020   HGB 13.2 08/17/2020   HCT 39.5 08/17/2020   PLT 287.0 08/17/2020   GLUCOSE 125 (H) 08/06/2020   CHOL 188 03/07/2020   TRIG 100.0 03/07/2020   HDL 42.30 03/07/2020   LDLDIRECT 147.3 02/21/2013   LDLCALC 126 (H) 03/07/2020   ALT 14 08/06/2020   AST 19 08/06/2020   NA 135 08/06/2020   K 3.5 08/06/2020   CL 105 08/06/2020   CREATININE 0.91 08/06/2020   BUN 15 08/06/2020   CO2 23 08/06/2020   TSH 1.28 03/07/2020   INR 1.0 07/01/2017   HGBA1C 5.6 03/07/2020    No results found.  Assessment & Plan:   Problem List Items Addressed This Visit       Unprioritized   Diarrhea of presumed infectious origin - Primary    GI Panel was negative,  But she has egg laying hens at home . will treat empirically for salmonella with cipro , repeat labs and refer to GI for evaluation if diarrhea does not resolve.  . Lab Results  Component Value Date   WBC 4.9 08/17/2020   HGB 13.2 08/17/2020   HCT 39.5 08/17/2020   MCV 92.1 08/17/2020   PLT 287.0 08/17/2020   Lab Results  Component Value Date   ESRSEDRATE 1 08/17/2020   Lab Results  Component Value Date   CRP <1.0 08/17/2020   Lab Results  Component Value Date   NA 135 08/06/2020   K 3.5 08/06/2020   CL 105 08/06/2020   CO2 23 08/06/2020          Relevant Orders   Ambulatory referral to Gastroenterology   Magnesium (Completed)   Basic metabolic panel   CBC with Differential/Platelet (Completed)   Sedimentation rate (Completed)   C-reactive protein (Completed)   I provided  30 minutes of  face-to-face time during this encounter reviewing patient's current  symptoms of diarrhea ,  recent ER Visit , labs and imaging studies, providing counseling on the above mentioned problems , and coordination  of care .   I have discontinued Alyssa Melendez's doxycycline. I am also having her start on ciprofloxacin.  Additionally, I am having her maintain her aspirin, Cholecalciferol, atenolol, ALPRAZolam, dicyclomine, and diphenoxylate-atropine.  Meds ordered this encounter  Medications   ciprofloxacin (CIPRO) 500 MG tablet    Sig: Take 1 tablet (500 mg total) by mouth 2 (two) times daily for 10 days.    Dispense:  10 tablet    Refill:  0    Medications Discontinued During This Encounter  Medication Reason   doxycycline (VIBRA-TABS) 100 MG tablet     Follow-up: No follow-ups on file.   Crecencio Mc, MD

## 2020-08-17 NOTE — Patient Instructions (Signed)
Take the cipro for 5 days,  it covers most infectious diarrhea cuases including salmonella   Gi referral in process in case it does not resolve   Please take a probiotic ( Align, Flora[place] or Culturelle), the generic version of one of these over the counter medications, or a PROBIOTIC BEVERAGE  (kombucha,  [place] ) Yogurt, or another dietary source) for a minimum of 3 weeks to prevent a serious antibiotic associated diarrhea  Called clostridium dificile colitis.  Taking a probiotic may also prevent vaginitis due to yeast infections and can be continued indefinitely if you feel that it improves your digestion or your elimination (bowels).

## 2020-08-19 DIAGNOSIS — R197 Diarrhea, unspecified: Secondary | ICD-10-CM | POA: Insufficient documentation

## 2020-08-19 NOTE — Assessment & Plan Note (Signed)
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 will treat empirically for salmonella with cipro , repeat labs and refer to GI for evaluation if diarrhea does not resolve.  . Lab Results  Component Value Date   WBC 4.9 08/17/2020   HGB 13.2 08/17/2020   HCT 39.5 08/17/2020   MCV 92.1 08/17/2020   PLT 287.0 08/17/2020   Lab Results  Component Value Date   ESRSEDRATE 1 08/17/2020   Lab Results  Component Value Date   CRP <1.0 08/17/2020   Lab Results  Component Value Date   NA 135 08/06/2020   K 3.5 08/06/2020   CL 105 08/06/2020   CO2 23 08/06/2020

## 2020-08-21 ENCOUNTER — Encounter: Payer: Self-pay | Admitting: *Deleted

## 2020-12-31 ENCOUNTER — Telehealth: Payer: Self-pay | Admitting: Internal Medicine
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
## 2021-01-14 ENCOUNTER — Ambulatory Visit: Payer: No Typology Code available for payment source | Admitting: Certified Nurse Midwife

## 2021-01-14 ENCOUNTER — Encounter: Payer: Self-pay | Admitting: Certified Nurse Midwife

## 2021-01-14 ENCOUNTER — Other Ambulatory Visit: Payer: Self-pay

## 2021-01-14 VITALS — BP 120/84 | HR 73 | Ht 63.0 in | Wt 180.2 lb

## 2021-01-14 DIAGNOSIS — N926 Irregular menstruation, unspecified: Secondary | ICD-10-CM | POA: Diagnosis not present

## 2021-01-14 DIAGNOSIS — R635 Abnormal weight gain: Secondary | ICD-10-CM | POA: Diagnosis not present

## 2021-01-14 DIAGNOSIS — R4586 Emotional lability: Secondary | ICD-10-CM

## 2021-01-14 MED ORDER — TRIAMCINOLONE ACETONIDE 0.1 % EX CREA: 1.0000 "application " | TOPICAL_CREAM | Freq: Two times a day (BID) | CUTANEOUS | 0 refills | Status: DC

## 2021-01-14 MED ORDER — NORETHINDRONE 0.35 MG PO TABS: 1.0000 | ORAL_TABLET | Freq: Every day | ORAL | 11 refills | Status: DC

## 2021-01-14 NOTE — Progress Notes (Signed)
GYN ENCOUNTER NOTE  Subjective:       Alyssa Melendez is a 50 y.o. G30P2002 female is here for gynecologic evaluation of the following issues:  1. Irregular frequent periods for the past several months. She also notes mood changes , bloating and weight gain.   2.Rash inner thighs    Gynecologic History Patient's last menstrual period was 12/22/2020 (exact date). Contraception: vasectomy Last Pap: 12/2015. Results were: normal Last mammogram: 05/23/2020. 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 30 tablet 5   aspirin 81 MG tablet Take 81 mg by mouth daily.     atenolol (TENORMIN) 25 MG tablet Take 25 mg by mouth daily.     Cholecalciferol 2000 UNITS CAPS Take 1 capsule by mouth daily.     dicyclomine (BENTYL) 10 MG capsule Take 1 capsule (10 mg total) by mouth 4 (four) times  daily -  before meals and at bedtime for 3 days. 12 capsule 0   diphenoxylate-atropine (LOMOTIL) 2.5-0.025 MG tablet Take 1 tablet by mouth 4 (four) times daily as needed for diarrhea or loose stools. (Patient not taking: Reported on 01/14/2021)     No current facility-administered medications on file prior to visit.    Allergies  Allergen Reactions   Morphine And Related     GI - vomitting    Social History   Socioeconomic History   Marital status: Married    Spouse name: Not on file   Number of children: Not on file   Years of education: Not on file   Highest education level: Not on file  Occupational History   Not on file  Tobacco Use   Smoking status: Never   Smokeless tobacco: Never  Vaping Use   Vaping Use: Never used  Substance and Sexual Activity   Alcohol use: Yes    Comment: occassionally   Drug use: No   Sexual activity: Yes    Birth control/protection: None    Comment: vasectomy  Other Topics Concern   Not on file  Social History Narrative   Not on file   Social Determinants of Health   Financial Resource Strain: Not on file  Food Insecurity: Not on file  Transportation Needs: Not on file  Physical Activity: Not on file  Stress: Not on file  Social Connections: Not on file  Intimate Partner Violence: Not on file    Family History  Problem Relation Age of Onset   Hyperlipidemia Mother    Cancer Mother 71       melanoma   Arthritis Mother        bilateral total knee replacements   Heart disease Father        CABG x 3    Mental illness Father        Alzheimers Dementia   Hyperlipidemia Father    Stroke Father        ich   Breast cancer Neg Hx    Ovarian cancer Neg Hx    Colon cancer Neg Hx     The following portions of the patient's history were reviewed and updated as appropriate: allergies, current medications, past family history, past medical history, past social history, past surgical history and problem list.  Review of  Systems Review of Systems - Negative except as mentioned in HPI Review of Systems - General ROS: negative for - chills, fatigue, fever, hot flashes, malaise or night sweats Hematological and Lymphatic ROS: negative for - bleeding problems or swollen lymph nodes Gastrointestinal ROS: negative for - abdominal pain, blood in stools, change in bowel habits and nausea/vomiting Musculoskeletal ROS: negative for - joint pain, muscle pain or muscular weakness Genito-Urinary ROS: negative for - change in menstrual cycle, dysmenorrhea, dyspareunia, dysuria, genital discharge, genital ulcers, hematuria, incontinence, irregular/heavy menses, nocturia or pelvic pain  Objective:   BP 120/84   Pulse 73   Ht 5' 3"  (1.6 m)   Wt 180 lb 3.2 oz (81.7 kg)   LMP 12/22/2020 (Exact Date)   BMI 31.92 kg/m  CONSTITUTIONAL: Well-developed, well-nourished female in no acute distress.  HENT:  Normocephalic, atraumatic.  NECK: Normal range of motion, supple, no masses.  Normal thyroid.  SKIN: Skin is warm and dry. No rash noted. Not diaphoretic. No erythema. No pallor. [place]: Alert and oriented to person, place, and time. PSYCHIATRIC: Normal mood and affect. Normal behavior. Normal judgment and thought content. CARDIOVASCULAR:Not Examined RESPIRATORY: Not Examined BREASTS: Not Examined ABDOMEN: Soft, non distended; Non tender.  No Organomegaly. PELVIC:not indicated MUSCULOSKELETAL: Normal range of motion. No tenderness.  No cyanosis, clubbing, or edema.     Assessment:   Perimenopausal Irregular periods Mood changes    Plan:   Discussed perimenopausal symptoms and treatment options. Given pt cardiac history recommend avoidance of estrogen replacement. Discussed use of progesterone to control cycle. Reviewed IUD/POP/Nexplanon/depo injections . She would like to try the POP . Discussed use of Paxil for mood changes and avoidance of hot flashes. She will try the POP first and let me know about use of  Paxil. Orders placed for POP and kenalog cream for rash on inner thigh. Follow up in the next few months for annual exam.   Face to face time 15 min.   Philip Aspen, CNM

## 2021-01-29 NOTE — Telephone Encounter (Signed)
Alyssa Melendez from [place] [employer] called in regards to medical records. He has not heard or received anything back. He was looking for an update regarding this.  He can be reached at [phone]  His fax number is [phone]

## 2021-02-06 NOTE — Telephone Encounter (Signed)
LMTCB

## 2021-02-26 ENCOUNTER — Other Ambulatory Visit: Payer: Self-pay

## 2021-02-26 MED ORDER — CARESTART COVID-19 HOME TEST VI KIT
PACK | 0 refills | Status: DC
  Filled 2021-02-26: qty 2, 4d supply, fill #0

## 2021-03-13 ENCOUNTER — Other Ambulatory Visit: Payer: Self-pay

## 2021-03-13 ENCOUNTER — Ambulatory Visit (INDEPENDENT_AMBULATORY_CARE_PROVIDER_SITE_OTHER): Payer: No Typology Code available for payment source | Admitting: Internal Medicine

## 2021-03-13 ENCOUNTER — Encounter: Payer: Self-pay | Admitting: Internal Medicine

## 2021-03-13 VITALS — BP 100/70 | HR 76 | Temp 97.4°F | Ht 62.5 in | Wt 180.0 lb

## 2021-03-13 DIAGNOSIS — I4581 Long QT syndrome: Secondary | ICD-10-CM

## 2021-03-13 DIAGNOSIS — R0609 Other forms of dyspnea: Secondary | ICD-10-CM | POA: Insufficient documentation

## 2021-03-13 DIAGNOSIS — R7301 Impaired fasting glucose: Secondary | ICD-10-CM | POA: Diagnosis not present

## 2021-03-13 DIAGNOSIS — Z79899 Other long term (current) drug therapy: Secondary | ICD-10-CM | POA: Diagnosis not present

## 2021-03-13 DIAGNOSIS — E785 Hyperlipidemia, unspecified: Secondary | ICD-10-CM

## 2021-03-13 DIAGNOSIS — Z0000 Encounter for general adult medical examination without abnormal findings: Secondary | ICD-10-CM

## 2021-03-13 DIAGNOSIS — Z95810 Presence of automatic (implantable) cardiac defibrillator: Secondary | ICD-10-CM

## 2021-03-13 DIAGNOSIS — E663 Overweight: Secondary | ICD-10-CM

## 2021-03-13 DIAGNOSIS — Z1231 Encounter for screening mammogram for malignant neoplasm of breast: Secondary | ICD-10-CM | POA: Diagnosis not present

## 2021-03-13 LAB — COMPREHENSIVE METABOLIC PANEL
ALT: 13 U/L (ref 0–35)
AST: 16 U/L (ref 0–37)
Albumin: 4.3 g/dL (ref 3.5–5.2)
Alkaline Phosphatase: 47 U/L (ref 39–117)
BUN: 12 mg/dL (ref 6–23)
CO2: 29 mEq/L (ref 19–32)
Calcium: 9 mg/dL (ref 8.4–10.5)
Chloride: 104 mEq/L (ref 96–112)
Creatinine, Ser: 0.93 mg/dL (ref 0.40–1.20)
GFR: 72.64 mL/min (ref >60.00)
Glucose, Bld: 88 mg/dL (ref 70–99)
Potassium: 4.7 mEq/L (ref 3.5–5.1)
Sodium: 138 mEq/L (ref 135–145)
Total Bilirubin: 0.5 mg/dL (ref 0.2–1.2)
Total Protein: 6.6 g/dL (ref 6.0–8.3)

## 2021-03-13 LAB — LIPID PANEL
Cholesterol: 189 mg/dL (ref 0–200)
HDL: 40.6 mg/dL (ref >39.00)
LDL Cholesterol: 136 mg/dL — ABNORMAL HIGH (ref 0–99)
NonHDL: 148.27
Total CHOL/HDL Ratio: 5
Triglycerides: 61 mg/dL (ref 0.0–149.0)
VLDL: 12.2 mg/dL (ref 0.0–40.0)

## 2021-03-13 LAB — CBC WITH DIFFERENTIAL/PLATELET
Basophils Absolute: 0.1 10*3/uL (ref 0.0–0.1)
Basophils Relative: 1.9 % (ref 0.0–3.0)
Eosinophils Absolute: 0.2 10*3/uL (ref 0.0–0.7)
Eosinophils Relative: 5.3 % — ABNORMAL HIGH (ref 0.0–5.0)
HCT: 41.9 % (ref 36.0–46.0)
Hemoglobin: 13.7 g/dL (ref 12.0–15.0)
Lymphocytes Relative: 20 % (ref 12.0–46.0)
Lymphs Abs: 0.9 10*3/uL (ref 0.7–4.0)
MCHC: 32.6 g/dL (ref 30.0–36.0)
MCV: 92.7 fl (ref 78.0–100.0)
Monocytes Absolute: 0.5 10*3/uL (ref 0.1–1.0)
Monocytes Relative: 11.7 % (ref 3.0–12.0)
Neutro Abs: 2.7 10*3/uL (ref 1.4–7.7)
Neutrophils Relative %: 61.1 % (ref 43.0–77.0)
Platelets: 264 10*3/uL (ref 150.0–400.0)
RBC: 4.52 Mil/uL (ref 3.87–5.11)
RDW: 13.3 % (ref 11.5–15.5)
WBC: 4.4 10*3/uL (ref 4.0–10.5)

## 2021-03-13 LAB — TSH: TSH: 1.54 u[IU]/mL (ref 0.35–5.50)

## 2021-03-13 LAB — HEMOGLOBIN A1C: Hgb A1c MFr Bld: 5.6 % (ref 4.6–6.5)

## 2021-03-13 NOTE — Progress Notes (Addendum)
Patient ID: Alyssa Melendez, female    DOB: April 01, 1970  Age: 51 y.o. MRN: [id number]  The patient is here for annual preventive  examination and management of other chronic and acute problems.   The risk factors are reflected in the social history.  The roster of all physicians providing medical care to patient - is listed in the Snapshot section of the chart.  Activities of daily living:  The patient is 100% independent in all ADLs: dressing, toileting, feeding as well as independent mobility  Home safety : The patient has smoke detectors in the home. They wear seatbelts.  There are no firearms at home. There is no violence in the home.   There is no risks for hepatitis, STDs or HIV. There is no   history of blood transfusion. They have no travel history to infectious disease endemic areas of the world.  The patient has seen their dentist in the last six month. They have seen their eye doctor in the last year. They deny  hearing difficulty with regard to whispered voices and some television programs.  They have deferred audiologic testing in the last year.  They do not  have excessive sun exposure. Discussed the need for sun protection: hats, long sleeves and use of sunscreen if there is significant sun exposure.   Diet: the importance of a healthy diet is discussed. They do have a healthy diet.  The benefits of regular aerobic exercise were discussed. She walks 3 times per week ,  45 minutes.   Depression screen: there are no signs or vegative symptoms of depression- irritability, change in appetite, anhedonia, sadness/tearfullness.  The following portions of the patient's history were reviewed and updated as appropriate: allergies, current medications, past family history, past medical history,  past surgical history, past social history  and problem list.  Visual acuity was not assessed per patient preference since she has regular follow up with her ophthalmologist. Hearing and body mass  index were assessed and reviewed.   During the course of the visit the patient was educated and counseled about appropriate screening and preventive services including : fall prevention , diabetes screening, nutrition counseling, colorectal cancer screening, and recommended immunizations.    CC: The primary encounter diagnosis was Routine general medical examination at a health care facility. Diagnoses of Encounter for screening mammogram for malignant neoplasm of breast, Hyperlipidemia, unspecified hyperlipidemia type, Impaired fasting glucose, Long-term use of high-risk medication, Exertional dyspnea, Long Q-T syndrome, AICD (automatic cardioverter/defibrillator) present, and Overweight were also pertinent to this visit.  1) had a stress echo recently for evaluation of dyspnea she has been experiencing  with stair climbing . She denies dyspnea  with walking  on flat terrain,  and walks on a treadmill at the gym .  Valves were ok.  History of COVID in 2021 .  No change in pulse oximetry per patient with exercise.    2) irregular bleeding,  no change with Micronor Month 3.  Seeing GYN  for management and planning to try an IUD  3) overweight :  she has gained weight.  She is  walking for exercise on a treadmill  [facility] per [facility] for 45 minutes.     History Alyssa Melendez has a past medical history of Barlow syndrome, Cardiac arrest ([place]) (11/22/2012), Complex ovarian cyst, Congestive heart failure ([place]), Fibroadenoma, sudden cardiac arrest (2009), Increased BMI, LLQ pain, Long Q-T syndrome (2009), Mitral valve prolapse, and OAB (overactive bladder).  She has a past surgical history that includes Cardiac defibrillator placement (2009); Cesarean section (2007 and 2005); Breast surgery (2001); Cardiac defibrillator placement (June 2013); Mitral valve repair; Breast biopsy (Left); and Breast excisional biopsy (Left).   Her family history includes Arthritis in her mother; Cancer (age  of onset: 56) in her mother; Heart disease in her father; Hyperlipidemia in her father and mother; Mental illness in her father; Stroke in her father.She reports that she has never smoked. She has never used smokeless tobacco. She reports current alcohol use. She reports that she does not use drugs.  Outpatient Medications Prior to Visit  Medication Sig Dispense Refill   ALPRAZolam (XANAX) 0.25 MG tablet Take 1 tablet (0.25 mg total) by mouth at bedtime as needed for anxiety. 30 tablet 5   aspirin 81 MG tablet Take 81 mg by mouth daily.     atenolol (TENORMIN) 25 MG tablet Take 25 mg by mouth daily.     Cholecalciferol 2000 UNITS CAPS Take 1 capsule by mouth daily.     norethindrone (MICRONOR) 0.35 MG tablet Take 1 tablet (0.35 mg total) by mouth daily. 30 tablet 11   triamcinolone cream (KENALOG) 0.1 % Apply 1 application topically 2 (two) times daily. 30 g 0   COVID-19 At Home Antigen Test (CARESTART COVID-19 HOME TEST) KIT use as directed 2 kit 0   No facility-administered medications prior to visit.    Review of Systems  Patient denies headache, fevers, malaise, unintentional weight loss, skin rash, eye pain, sinus congestion and sinus pain, sore throat, dysphagia,  hemoptysis , cough, dyspnea, wheezing, chest pain, palpitations, orthopnea, edema, abdominal pain, nausea, melena, diarrhea, constipation, flank pain, dysuria, hematuria, urinary  Frequency, nocturia, numbness, tingling, seizures,  Focal weakness, Loss of consciousness,  Tremor, insomnia, depression, anxiety, and suicidal ideation.     Objective:  BP 100/70 (BP Location: Left Arm, Patient Position: Sitting, Cuff Size: Large)    Pulse 76    Temp (!) 97.4 F (36.3 C) (Oral)    Ht 5' 2.5" (1.588 m)    Wt 180 lb (81.6 kg)    SpO2 99%    BMI 32.40 kg/m   Physical Exam  General appearance: alert, cooperative and appears stated age Head: Normocephalic, without obvious abnormality, atraumatic Eyes: conjunctivae/corneas clear.  PERRL, EOM's intact. Fundi benign. Ears: normal TM's and external ear canals both ears Nose: Nares normal. Septum midline. Mucosa normal. No drainage or sinus tenderness. Throat: lips, mucosa, and tongue normal; teeth and gums normal Neck: no adenopathy, no carotid bruit, no JVD, supple, symmetrical, trachea midline and thyroid not enlarged, symmetric, no tenderness/mass/nodules Lungs: clear to auscultation bilaterally Breasts: normal appearance, no masses or tenderness Heart: regular rate and rhythm, S1, S2 normal, no murmur, click, rub or gallop Abdomen: soft, non-tender; bowel sounds normal; no masses,  no organomegaly Extremities: extremities normal, atraumatic, no cyanosis or edema Pulses: 2+ and symmetric Skin: Skin color, texture, turgor normal. No rashes or lesions Neurologic: Alert and oriented X 3, normal strength and tone. Normal symmetric reflexes. Normal coordination and gait.     Assessment & Plan:   Problem List Items Addressed This Visit     Long Q-T syndrome    With history of  Vfib arrest in 2009 managed at [place]  with AICD . Genetic testing done by EP was negative for gene mutations. Continue Corguard. Patient aware of medications that can prloong QT       Routine general medical examination at a health care facility -  Primary    age appropriate education and counseling updated, referrals for preventative services and immunizations addressed, dietary and smoking counseling addressed, most recent labs reviewed.  I have personally reviewed and have noted:   1) the patient's medical and social history 2) The pt's use of alcohol, tobacco, and illicit drugs 3) The patient's current medications and supplements 4) Functional ability including ADL's, fall risk, home safety risk, hearing and visual impairment 5) Diet and physical activities 6) Evidence for depression or mood disorder 7) The patient's height, weight, and BMI have been recorded in the chart   I have made  referrals, and provided counseling and education based on review of the above      Hyperlipidemia with target LDL less than 100    LDL and triglycerides reviewed   Current risk of CAD is <2% . She has no side effects and liver enzymes are normal.   Lab Results  Component Value Date   CHOL 189 03/13/2021   HDL 40.60 03/13/2021   LDLCALC 136 (H) 03/13/2021   LDLDIRECT 147.3 02/21/2013   TRIG 61.0 03/13/2021   CHOLHDL 5 03/13/2021          Overweight    I have addressed  BMI and recommended continued use of a low glycemic index diet and increased intensity of her exercise program given her normal stress ECHO      AICD (automatic cardioverter/defibrillator) present    Due to history of V fib arrest and long QT syndrome.  Replaced in August 2020      Exertional dyspnea    She reports dyspnea with stair climbing only,  Not with walking on flat surfaces.  No chest pain or bronchospasm.  Stress ECHO was doneby her EP specialist  for eval and normal.  Obesity and deconditioning likely the cause, but will rule out intrinsic lung disease with p ulmonology consult given history of COVID in 2021       Relevant Orders   Ambulatory referral to Pulmonology   Other Visit Diagnoses     Encounter for screening mammogram for malignant neoplasm of breast       Relevant Orders   MM 3D SCREEN BREAST BILATERAL   Impaired fasting glucose       Relevant Orders   Comp Met (CMET) (Completed)   HgB A1c (Completed)   Long-term use of high-risk medication       Relevant Orders   CBC with Differential/Platelet (Completed)   TSH (Completed)       I have discontinued Alyssa Melendez's Carestart COVID-19 Home Test. I am also having her maintain her aspirin, Cholecalciferol, atenolol, ALPRAZolam, norethindrone, and triamcinolone cream.  No orders of the defined types were placed in this encounter.   Medications Discontinued During This Encounter  Medication Reason   COVID-19 At Home Antigen  Test [facility] COVID-19 HOME TEST) KIT     Follow-up: No follow-ups on file.   Crecencio Mc, MD

## 2021-03-13 NOTE — Patient Instructions (Addendum)
Your annual mammogram has been ordered.  You are encouraged (required) to call to make your appointment at [facility]   Referral to [facility] Pulmonology is in process for the dyspnea.  While you are waiting,  start walking on a GENTLE incline when you use the treadmill and gradually increase the incline as you become accustomed to it

## 2021-03-13 NOTE — Assessment & Plan Note (Signed)
She reports dyspnea with stair climbing only,  Not with walking on flat surfaces.  No chest pain or bronchospasm.  Stress ECHO was doneby her EP specialist  for eval and normal.  Obesity and deconditioning likely the cause, but will rule out intrinsic lung disease with p ulmonology consult given history of COVID in 2021

## 2021-03-14 ENCOUNTER — Encounter: Payer: Self-pay | Admitting: Internal Medicine

## 2021-03-14 NOTE — Assessment & Plan Note (Signed)

## 2021-03-16 NOTE — Assessment & Plan Note (Signed)
LDL and triglycerides reviewed   Current risk of CAD is <2% . She has no side effects and liver enzymes are normal.   Lab Results  Component Value Date   CHOL 189 03/13/2021   HDL 40.60 03/13/2021   LDLCALC 136 (H) 03/13/2021   LDLDIRECT 147.3 02/21/2013   TRIG 61.0 03/13/2021   CHOLHDL 5 03/13/2021

## 2021-03-16 NOTE — Assessment & Plan Note (Signed)
With history of  Vfib arrest in 2009 managed at [place]  with AICD . Genetic testing done by EP was negative for gene mutations. Continue Corguard. Patient aware of medications that can prloong QT

## 2021-03-16 NOTE — Assessment & Plan Note (Signed)
I have addressed  BMI and recommended continued use of a low glycemic index diet and increased intensity of her exercise program given her normal stress ECHO

## 2021-03-16 NOTE — Assessment & Plan Note (Addendum)
Due to history of V fib arrest and long QT syndrome.  Replaced in August 2020

## 2021-03-25 ENCOUNTER — Other Ambulatory Visit: Payer: Self-pay

## 2021-03-25 ENCOUNTER — Ambulatory Visit
Admission: RE | Admit: 2021-03-25 | Discharge: 2021-03-25 | Disposition: A | Discharge status: Home / Self Care | Payer: No Typology Code available for payment source | Attending: Internal Medicine | Admitting: Internal Medicine

## 2021-03-25 ENCOUNTER — Ambulatory Visit: Payer: No Typology Code available for payment source | Admitting: Internal Medicine

## 2021-03-25 ENCOUNTER — Encounter: Payer: Self-pay | Admitting: Internal Medicine

## 2021-03-25 ENCOUNTER — Ambulatory Visit
Admission: RE | Admit: 2021-03-25 | Discharge: 2021-03-25 | Disposition: A | Discharge status: Home / Self Care | Payer: No Typology Code available for payment source | Source: Ambulatory Visit | Attending: Internal Medicine | Admitting: Internal Medicine

## 2021-03-25 DIAGNOSIS — R0609 Other forms of dyspnea: Secondary | ICD-10-CM

## 2021-03-25 NOTE — Assessment & Plan Note (Signed)
Onset p cardiac arrest 2009 assoc with wt gain  - s/p MV repair 2012 - Stress Echo 10/11/20 nl   She is likely not doing enough sub max aerobic ex at this point to get the training benefit to be able to run up steps, her stated goal, esp carryig extra wt than she did before she noted this problem  She is seeing Dr Derrel Nip for gen health care with labs ok 03/13/21,  so if she does regular submax aerobic ex at least 30 min 5 x weekly over the next several weeks, with no desats, and doesn't note benefit, the next step is CPST in [place].  Pulmonary f/u can then be prn          Each maintenance medication was reviewed in detail including emphasizing most importantly the difference between maintenance and prns and under what circumstances the prns are to be triggered using an action plan format where appropriate.  Total time for H and P, chart review, counseling, reviewing oximeter device(s) and generating customized AVS unique to this initial office visit / same day charting  > 63mn

## 2021-03-25 NOTE — Progress Notes (Signed)
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 @ [employer] never smoker onset of doe p  cardiac arrest 2009 then MV repair at [facility] 2012 but never back to baseline ex tol  referred to pulmonary clinic in [facility]  03/25/2021 by Dr Derrel Nip for gradually worse doe  x 2018  with baseline wt 2017 = 170     History of Present Illness  03/25/2021  Pulmonary/ 1st office eval/ Davion Meara / [employer] @ wt 183   Dyspnea:  walks 3 x weekly at [facility] x total of 45 min aerobics and fine walking on flat surface, out of breath with steps Cough: none Sleep: on side bed is flat  SABA use: none  No obvious day to day or daytime variability or assoc excess/ purulent sputum or mucus plugs or hemoptysis or cp or chest tightness, subjective wheeze or overt sinus or hb symptoms.   Sleeping  without nocturnal  or early am exacerbation  of respiratory  c/o's or need for noct saba. Also denies any obvious fluctuation of symptoms with weather or environmental changes or other aggravating or alleviating factors except as outlined above   No unusual exposure hx or h/o childhood pna/ asthma or knowledge of premature birth.  Current Allergies, Complete Past Medical History, Past Surgical History, Family History, and Social History were reviewed in [employer] record.  ROS  The following are not active complaints unless bolded Hoarseness, sore throat, dysphagia, dental problems, itching, sneezing,  nasal congestion or discharge of excess mucus or purulent secretions, ear ache,   fever, chills, sweats, unintended wt loss or wt gain, classically pleuritic or exertional cp,  orthopnea pnd or arm/hand swelling  or leg swelling, presyncope, palpitations, abdominal pain, anorexia, nausea, vomiting, diarrhea  or change in bowel habits or change in bladder habits, change in stools or change in urine, dysuria, hematuria,  rash, arthralgias, visual  complaints, headache, numbness, weakness or ataxia or problems with walking or coordination,  change in mood or  memory.             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 30 tablet 5   aspirin 81 MG tablet Take 81 mg by mouth daily.     atenolol (TENORMIN) 25 MG tablet Take 25 mg by mouth daily.     Cholecalciferol 2000 UNITS CAPS Take 1 capsule by mouth daily.     norethindrone (MICRONOR) 0.35 MG tablet Take 1 tablet (0.35 mg total) by mouth daily. 30 tablet 11   triamcinolone cream (KENALOG) 0.1 % Apply 1 application topically 2 (two) times daily. 30 g 0   No facility-administered medications prior to visit.     Objective:     BP 100/80 (BP Location: Left Arm, Patient Position: Sitting, Cuff  Size: Normal)    Pulse 80    Temp (!) 97.1 F (36.2 C) (Oral)    Ht 5' 2.5" (1.588 m)    Wt 183 lb 6.4 oz (83.2 kg)    SpO2 95%    BMI 33.01 kg/m   SpO2: 95 % RA  Pleasant amb wf nad    HEENT : pt wearing mask not removed for exam due to covid -19 concerns.    NECK :  without JVD/Nodes/TM/ nl carotid upstrokes bilaterally   LUNGS: no acc muscle use,  Nl contour chest which is clear to A and P bilaterally without cough on insp or exp maneuvers   CV:  RRR  no s3 or murmur or increase in P2, and no edema   ABD:  soft and nontender with nl inspiratory excursion in the supine position. No bruits or organomegaly appreciated, bowel sounds nl  MS:  Nl gait/ ext warm without  deformities, calf tenderness, cyanosis or clubbing No obvious joint restrictions   SKIN: warm and dry without lesions    NEURO:  alert, approp, nl sensorium with  no motor or cerebellar deficits apparent.    CXR PA and Lateral:   03/25/2021 :    I personally reviewed images and impression is as follows:     Wnl      Assessment   Exertional dyspnea Onset p cardiac arrest 2009 assoc with wt gain  - s/p MV repair 2012 - Stress Echo 10/11/20 nl   She is likely not doing enough sub max aerobic ex at this point to get the training benefit to be able to run up steps, her stated goal, esp carryig extra wt than she did before she noted this problem  She is seeing Dr Derrel Nip for gen health care with labs ok 03/13/21,  so if she does regular submax aerobic ex at least 30 min 5 x weekly over the next several weeks, with no desats, and doesn't note benefit, the next step is CPST in [place].  Pulmonary f/u can then be prn          Each maintenance medication was reviewed in detail including emphasizing most importantly the difference between maintenance and prns and under what circumstances the prns are to be triggered using an action plan format where appropriate.  Total time for H and P, chart review, counseling, reviewing oximeter device(s) and generating customized AVS unique to this initial office visit / same day charting  > 16mn           MChristinia Gully MD 03/25/2021

## 2021-03-25 NOTE — Patient Instructions (Signed)
To get the most out of exercise, you need to be continuously aware that you are short of breath, but never out of breath, for at least 30 minutes daily. As you improve, it will actually be easier for you to do the same amount of exercise  in  30 minutes so always push to the level where you are short of breath.     Make sure you check your oxygen saturations at highest level of activity   If not improving after a few weeks, call to schedule CPST   Please remember to go to the  x-ray department  for your tests - we will call you with the results when they are available

## 2021-04-10 ENCOUNTER — Other Ambulatory Visit (HOSPITAL_COMMUNITY)
Admission: RE | Admit: 2021-04-10 | Discharge: 2021-04-10 | Disposition: A | Discharge status: Home / Self Care | Payer: No Typology Code available for payment source | Source: Ambulatory Visit | Attending: Certified Nurse Midwife | Admitting: Certified Nurse Midwife

## 2021-04-10 ENCOUNTER — Other Ambulatory Visit: Payer: Self-pay

## 2021-04-10 ENCOUNTER — Ambulatory Visit (INDEPENDENT_AMBULATORY_CARE_PROVIDER_SITE_OTHER): Payer: No Typology Code available for payment source | Admitting: Certified Nurse Midwife

## 2021-04-10 ENCOUNTER — Encounter: Payer: Self-pay | Admitting: Certified Nurse Midwife

## 2021-04-10 VITALS — BP 106/71 | HR 72 | Ht 62.0 in | Wt 182.3 lb

## 2021-04-10 DIAGNOSIS — Z01419 Encounter for gynecological examination (general) (routine) without abnormal findings: Secondary | ICD-10-CM

## 2021-04-10 MED ORDER — OXYCODONE-ACETAMINOPHEN 5-325 MG PO TABS: 1.0000 | ORAL_TABLET | ORAL | 0 refills | Status: DC | PRN

## 2021-04-10 NOTE — Addendum Note (Signed)
Addended by: Ova Freshwater on: 04/10/2021 11:32 AM   Modules accepted: Orders

## 2021-04-10 NOTE — Progress Notes (Signed)
GYNECOLOGY ANNUAL PREVENTATIVE CARE ENCOUNTER NOTE  History:     Alyssa Melendez is a 51 y.o. [id number] female here for a routine annual gynecologic exam.  Current complaints: interested in IUD for bleeding control .   Denies abnormal vaginal bleeding, discharge, pelvic pain, problems with intercourse or other gynecologic concerns.     Social Relationship: married  Living: spouse  Work: [employer] office  Exercise: Smoke/Alcohol/drug use: denies use   Gynecologic History Patient's last menstrual period was 04/01/2021 (exact date). Contraception: oral progesterone-only contraceptive Last Pap: 12/20/2015. Results were: normal with negative HPV Last mammogram: 05/23/2020. 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 30 tablet 5   aspirin 81 MG  tablet Take 81 mg by mouth daily.     atenolol (TENORMIN) 25 MG tablet Take 25 mg by mouth daily.     Cholecalciferol 2000 UNITS CAPS Take 1 capsule by mouth daily.     norethindrone (MICRONOR) 0.35 MG tablet Take 1 tablet (0.35 mg total) by mouth daily. 30 tablet 11   triamcinolone cream (KENALOG) 0.1 % Apply 1 application topically 2 (two) times daily. 30 g 0   No current facility-administered medications on file prior to visit.    Allergies  Allergen Reactions   Morphine And Related     GI - vomitting    Social History:  reports that she has never smoked. She has never used smokeless tobacco. She reports current alcohol use. She reports that she does not use drugs.  Family History  Problem Relation Age of Onset   Hyperlipidemia Mother    Cancer Mother 69       melanoma   Arthritis Mother        bilateral total knee replacements   Heart disease Father        CABG x 3    Mental illness Father        Alzheimers Dementia   Hyperlipidemia Father  Stroke Father        ich   Breast cancer Neg Hx    Ovarian cancer Neg Hx    Colon cancer Neg Hx     The following portions of the patient's history were reviewed and updated as appropriate: allergies, current medications, past family history, past medical history, past social history, past surgical history and problem list.  Review of Systems Pertinent items noted in HPI and remainder of comprehensive ROS otherwise negative.  Physical Exam:  BP 106/71    Pulse 72    Ht 5' 2"  (1.575 m)    Wt 182 lb 4.8 oz (82.7 kg)    LMP 04/01/2021 (Exact Date)    BMI 33.34 kg/m  CONSTITUTIONAL: Well-developed, well-nourished female in no acute distress.  HENT:  Normocephalic, atraumatic, External right and left ear normal. Oropharynx is clear and moist EYES: Conjunctivae and EOM are normal. Pupils are equal, round, and reactive to light. No scleral icterus.  NECK: Normal range of motion, supple, no masses.  Normal thyroid.  SKIN: Skin is warm  and dry. No rash noted. Not diaphoretic. No erythema. No pallor. MUSCULOSKELETAL: Normal range of motion. No tenderness.  No cyanosis, clubbing, or edema.  2+ distal pulses. NEUROLOGIC: Alert and oriented to person, place, and time. Normal reflexes, muscle tone coordination.  PSYCHIATRIC: Normal mood and affect. Normal behavior. Normal judgment and thought content. CARDIOVASCULAR: Normal heart rate noted, regular rhythm RESPIRATORY: Clear to auscultation bilaterally. Effort and breath sounds normal, no problems with respiration noted. BREASTS: Symmetric in size. No masses, tenderness, skin changes, nipple drainage, or lymphadenopathy bilaterally.  ABDOMEN: Soft, no distention noted.  No tenderness, rebound or guarding.  PELVIC: Normal appearing external genitalia and urethral meatus; normal appearing vaginal mucosa and cervix.  No abnormal discharge noted.  Pap smear obtained.  Normal uterine size, no other palpable masses, no uterine or adnexal tenderness.  .   Assessment and Plan:    1. Well woman exam with routine gynecological exam    Pap: Will follow up results of pap smear and manage accordingly. Mammogram : ordererd by PCP Labs: none due  Refill/orders : percocet for IUD placement. Cervix will require dilators for placement  Referral: none  Routine preventative health maintenance measures emphasized. Please refer to After Visit Summary for other counseling recommendations.    Reviewed allergy: pt state she has taken percocet before without any issues.   Philip Aspen, CNM [facility]

## 2021-04-16 ENCOUNTER — Encounter: Payer: Self-pay | Admitting: Obstetrics

## 2021-04-16 LAB — CYTOLOGY - PAP
Comment: NEGATIVE
Diagnosis: NEGATIVE
High risk HPV: NEGATIVE

## 2021-05-06 ENCOUNTER — Encounter: Payer: Self-pay | Admitting: Certified Nurse Midwife

## 2021-05-06 ENCOUNTER — Ambulatory Visit: Payer: No Typology Code available for payment source | Admitting: Certified Nurse Midwife

## 2021-05-06 ENCOUNTER — Other Ambulatory Visit: Payer: Self-pay

## 2021-05-06 VITALS — BP 115/77 | HR 74 | Ht 62.0 in | Wt 185.4 lb

## 2021-05-06 DIAGNOSIS — Z30019 Encounter for initial prescription of contraceptives, unspecified: Secondary | ICD-10-CM

## 2021-05-06 LAB — POCT URINE PREGNANCY: Preg Test, Ur: NEGATIVE

## 2021-05-06 NOTE — Patient Instructions (Signed)
IUD PLACEMENT POST-PROCEDURE INSTRUCTIONS ? ?You may take Ibuprofen, Aleve or Tylenol for pain if needed.  Cramping should resolve within in 24 hours. ? ?You may have a small amount of spotting.  You should wear a mini pad for the next few days. ? ?You may have intercourse after 24 hours.  If you using this for birth control, it is effective immediately. ? ?You need to call if you have any pelvic pain, fever, heavy bleeding or foul smelling vaginal discharge.  Irregular bleeding is common the first several months after having an IUD placed. You do not need to call for this reason unless you are concerned. ? ?Shower or bathe as normal ? ?You should have a follow-up appointment in 4-8 weeks for a re-check to make sure you are not having any problems. ?

## 2021-05-06 NOTE — Progress Notes (Signed)
? ?  GYNECOLOGY OFFICE PROCEDURE NOTE ? ?Alyssa Melendez is a 51 y.o. [id number] here for Mirena IUD insertion. No GYN concerns.  Last pap smear was on 04/10/2021 and was normal. ? ?IUD Insertion Procedure Note ?Patient identified, informed consent performed, consent signed.   Discussed risks of irregular bleeding, cramping, infection, malpositioning or misplacement of the IUD outside the uterus which may require further procedure such as laparoscopy. Also discussed >99% contraception efficacy, increased risk of ectopic pregnancy with failure of method.   Emphasized that this did not protect against STIs, condoms recommended during all sexual encounters. Time out was performed.  Urine pregnancy test negative. ? ?Speculum placed in the vagina.  Cervix visualized.  Cleaned with Betadine x 2.  Grasped anteriorly with a single tooth tenaculum. @ cervical dialators used .  Uterus sounded to 7 cm.  Mirena IUD placed per manufacturer's recommendations.  Strings trimmed to 3 cm. Tenaculum was removed, good hemostasis noted.  Patient tolerated procedure well.  ? ?Patient was given post-procedure instructions.  She was advised to have backup contraception for one week.  Patient was also asked to check IUD strings periodically and follow up in 4 weeks for IUD check. ? ? ?Philip Aspen, CNM  ?

## 2021-06-03 ENCOUNTER — Other Ambulatory Visit: Payer: Self-pay | Admitting: Internal Medicine

## 2021-06-03 ENCOUNTER — Ambulatory Visit: Payer: No Typology Code available for payment source | Admitting: Certified Nurse Midwife

## 2021-06-03 ENCOUNTER — Ambulatory Visit
Admission: RE | Admit: 2021-06-03 | Discharge: 2021-06-03 | Disposition: A | Discharge status: Home / Self Care | Payer: No Typology Code available for payment source | Source: Ambulatory Visit | Attending: Internal Medicine | Admitting: Internal Medicine

## 2021-06-03 VITALS — BP 104/71 | HR 77 | Ht 63.0 in | Wt 185.3 lb

## 2021-06-03 DIAGNOSIS — Z30431 Encounter for routine checking of intrauterine contraceptive device: Secondary | ICD-10-CM | POA: Diagnosis not present

## 2021-06-03 DIAGNOSIS — Z1231 Encounter for screening mammogram for malignant neoplasm of breast: Secondary | ICD-10-CM | POA: Insufficient documentation

## 2021-06-03 DIAGNOSIS — R928 Other abnormal and inconclusive findings on diagnostic imaging of breast: Secondary | ICD-10-CM

## 2021-06-03 DIAGNOSIS — N63 Unspecified lump in unspecified breast: Secondary | ICD-10-CM

## 2021-06-03 NOTE — Progress Notes (Signed)
? ? ?  GYNECOLOGY OFFICE ENCOUNTER NOTE ? ?History:  ?51 y.o. [id number] here today for today for IUD string check; Mirena  IUD was placed  05/06/21. No complaints about the IUD, no concerning side effects. ? ?The following portions of the patient's history were reviewed and updated as appropriate: allergies, current medications, past family history, past medical history, past social history, past surgical history and problem list. Last pap smear on 04/10/21 was normal, negative HRHPV. ? ?Review of Systems:  ?Pertinent items are noted in HPI. ?  ?Objective:  ?Physical Exam ?Blood pressure 104/71, pulse 77, height 5' 3"  (1.6 m), weight 185 lb 4.8 oz (84.1 kg), last menstrual period 05/23/2021. ?CONSTITUTIONAL: Well-developed, well-nourished female in no acute distress.  ?NEUROLOGIC: Alert and oriented to person, place, and time. Normal reflexes, muscle tone coordination.  ?PSYCHIATRIC: Normal mood and affect. Normal behavior. Normal judgment and thought content. ?CARDIOVASCULAR: Normal heart rate noted ?RESPIRATORY: Effort and breath sounds normal, no problems with respiration noted ?ABDOMEN: Soft, no distention noted.   ?PELVIC: Normal appearing external genitalia; normal appearing vaginal mucosa and cervix.  IUD strings visualized, about 3 cm in length outside cervix. Done in the presence of a chaperone.  ? ?Assessment & Plan:  ?Patient to keep IUD in place for up to eight years; can come in for removal earlier if she desires or for any concerning side effects. Recommended condoms for STI prevention. ? ? ?Philip Aspen, CNM  ?

## 2021-06-07 ENCOUNTER — Ambulatory Visit
Admission: RE | Admit: 2021-06-07 | Discharge: 2021-06-07 | Disposition: A | Discharge status: Home / Self Care | Payer: No Typology Code available for payment source | Source: Ambulatory Visit | Attending: Internal Medicine | Admitting: Internal Medicine

## 2021-06-07 DIAGNOSIS — R928 Other abnormal and inconclusive findings on diagnostic imaging of breast: Secondary | ICD-10-CM | POA: Insufficient documentation

## 2021-06-07 DIAGNOSIS — N63 Unspecified lump in unspecified breast: Secondary | ICD-10-CM | POA: Insufficient documentation

## 2021-07-06 ENCOUNTER — Other Ambulatory Visit: Payer: Self-pay | Admitting: Certified Nurse Midwife

## 2021-11-07 ENCOUNTER — Other Ambulatory Visit: Payer: Self-pay | Admitting: Certified Nurse Midwife

## 2022-01-22 ENCOUNTER — Encounter: Payer: Self-pay | Admitting: Internal Medicine

## 2022-01-24 ENCOUNTER — Other Ambulatory Visit: Payer: Self-pay | Admitting: Internal Medicine

## 2022-01-24 MED ORDER — FLUCONAZOLE 150 MG PO TABS: 150.0000 mg | ORAL_TABLET | Freq: Every day | ORAL | 0 refills | Status: DC

## 2022-01-24 NOTE — Telephone Encounter (Signed)
Pt called regarding a mychart message she sent

## 2022-02-24 ENCOUNTER — Ambulatory Visit: Payer: No Typology Code available for payment source | Admitting: Family Medicine

## 2022-03-19 ENCOUNTER — Ambulatory Visit (INDEPENDENT_AMBULATORY_CARE_PROVIDER_SITE_OTHER): Payer: No Typology Code available for payment source | Admitting: Internal Medicine

## 2022-03-19 ENCOUNTER — Encounter: Payer: Self-pay | Admitting: Internal Medicine

## 2022-03-19 VITALS — BP 102/60 | HR 80 | Temp 97.9°F | Ht 63.0 in | Wt 188.0 lb

## 2022-03-19 DIAGNOSIS — R0609 Other forms of dyspnea: Secondary | ICD-10-CM

## 2022-03-19 DIAGNOSIS — Z0000 Encounter for general adult medical examination without abnormal findings: Secondary | ICD-10-CM

## 2022-03-19 DIAGNOSIS — Z79899 Other long term (current) drug therapy: Secondary | ICD-10-CM

## 2022-03-19 DIAGNOSIS — R7301 Impaired fasting glucose: Secondary | ICD-10-CM

## 2022-03-19 DIAGNOSIS — Z95810 Presence of automatic (implantable) cardiac defibrillator: Secondary | ICD-10-CM

## 2022-03-19 DIAGNOSIS — E785 Hyperlipidemia, unspecified: Secondary | ICD-10-CM | POA: Diagnosis not present

## 2022-03-19 DIAGNOSIS — Z1159 Encounter for screening for other viral diseases: Secondary | ICD-10-CM

## 2022-03-19 DIAGNOSIS — Z114 Encounter for screening for human immunodeficiency virus [HIV]: Secondary | ICD-10-CM

## 2022-03-19 DIAGNOSIS — Z1231 Encounter for screening mammogram for malignant neoplasm of breast: Secondary | ICD-10-CM

## 2022-03-19 DIAGNOSIS — I4581 Long QT syndrome: Secondary | ICD-10-CM

## 2022-03-19 LAB — COMPREHENSIVE METABOLIC PANEL
ALT: 12 U/L (ref 0–35)
AST: 14 U/L (ref 0–37)
Albumin: 4.4 g/dL (ref 3.5–5.2)
Alkaline Phosphatase: 60 U/L (ref 39–117)
BUN: 13 mg/dL (ref 6–23)
CO2: 28 mEq/L (ref 19–32)
Calcium: 9.2 mg/dL (ref 8.4–10.5)
Chloride: 103 mEq/L (ref 96–112)
Creatinine, Ser: 0.95 mg/dL (ref 0.40–1.20)
GFR: 70.31 mL/min (ref >60.00)
Glucose, Bld: 85 mg/dL (ref 70–99)
Potassium: 4.2 mEq/L (ref 3.5–5.1)
Sodium: 138 mEq/L (ref 135–145)
Total Bilirubin: 0.6 mg/dL (ref 0.2–1.2)
Total Protein: 6.8 g/dL (ref 6.0–8.3)

## 2022-03-19 LAB — CBC WITH DIFFERENTIAL/PLATELET
Basophils Absolute: 0 10*3/uL (ref 0.0–0.1)
Basophils Relative: 0.8 % (ref 0.0–3.0)
Eosinophils Absolute: 0.1 10*3/uL (ref 0.0–0.7)
Eosinophils Relative: 1.8 % (ref 0.0–5.0)
HCT: 42.1 % (ref 36.0–46.0)
Hemoglobin: 14.4 g/dL (ref 12.0–15.0)
Lymphocytes Relative: 20.2 % (ref 12.0–46.0)
Lymphs Abs: 1 10*3/uL (ref 0.7–4.0)
MCHC: 34.2 g/dL (ref 30.0–36.0)
MCV: 91 fl (ref 78.0–100.0)
Monocytes Absolute: 0.5 10*3/uL (ref 0.1–1.0)
Monocytes Relative: 9.5 % (ref 3.0–12.0)
Neutro Abs: 3.5 10*3/uL (ref 1.4–7.7)
Neutrophils Relative %: 67.7 % (ref 43.0–77.0)
Platelets: 277 10*3/uL (ref 150.0–400.0)
RBC: 4.63 Mil/uL (ref 3.87–5.11)
RDW: 13.5 % (ref 11.5–15.5)
WBC: 5.1 10*3/uL (ref 4.0–10.5)

## 2022-03-19 LAB — HEMOGLOBIN A1C: Hgb A1c MFr Bld: 5.8 % (ref 4.6–6.5)

## 2022-03-19 LAB — LIPID PANEL
Cholesterol: 221 mg/dL — ABNORMAL HIGH (ref 0–200)
HDL: 37.6 mg/dL — ABNORMAL LOW (ref >39.00)
LDL Cholesterol: 161 mg/dL — ABNORMAL HIGH (ref 0–99)
NonHDL: 183.04
Total CHOL/HDL Ratio: 6
Triglycerides: 108 mg/dL (ref 0.0–149.0)
VLDL: 21.6 mg/dL (ref 0.0–40.0)

## 2022-03-19 LAB — TSH: TSH: 1.35 u[IU]/mL (ref 0.35–5.50)

## 2022-03-19 LAB — LDL CHOLESTEROL, DIRECT: Direct LDL: 167 mg/dL

## 2022-03-19 MED ORDER — OMEPRAZOLE 20 MG PO CPDR: 20.0000 mg | DELAYED_RELEASE_CAPSULE | Freq: Every day | ORAL | 3 refills | Status: DC

## 2022-03-19 NOTE — Progress Notes (Unsigned)
Patient ID: Alyssa Melendez, female    DOB: 10-12-1970  Age: 52 y.o. MRN: [id number]  The patient is here for annual preventive examination and management of other chronic and acute problems.   The risk factors are reflected in the social history.   The roster of all physicians providing medical care to patient - is listed in the Snapshot section of the chart.   Activities of daily living:  The patient is 100% independent in all ADLs: dressing, toileting, feeding as well as independent mobility   Home safety : The patient has smoke detectors in the home. They wear seatbelts.  There are no unsecured firearms at home. There is no violence in the home.    There is no risks for hepatitis, STDs or HIV. There is no   history of blood transfusion. They have no travel history to infectious disease endemic areas of the world.   The patient has seen their dentist in the last six month. They have seen their eye doctor in the last year. The patinet  denies slight hearing difficulty with regard to whispered voices and some television programs.  They have deferred audiologic testing in the last year.  They do not  have excessive sun exposure. Discussed the need for sun protection: hats, long sleeves and use of sunscreen if there is significant sun exposure.    Diet: the importance of a healthy diet is discussed. They do have a healthy diet.   The benefits of regular aerobic exercise were discussed. The patient  exercises  3 to 5 days per week  for  60 minutes.    Depression screen: there are no signs or vegative symptoms of depression- irritability, change in appetite, anhedonia, sadness/tearfullness.   The following portions of the patient's history were reviewed and updated as appropriate: allergies, current medications, past family history, past medical history,  past surgical history, past social history  and problem list.   Visual acuity was not assessed per patient preference since the patient has  regular follow up with an  ophthalmologist. Hearing and body mass index were assessed and reviewed.    During the course of the visit the patient was educated and counseled about appropriate screening and preventive services including : fall prevention , diabetes screening, nutrition counseling, colorectal cancer screening, and recommended immunizations.    Chief Complaint:  NONE   Review of Symptoms  Patient denies headache, fevers, malaise, unintentional weight loss, skin rash, eye pain, sinus congestion and sinus pain, sore throat, dysphagia,  hemoptysis , cough, dyspnea, wheezing, chest pain, palpitations, orthopnea, edema, abdominal pain, nausea, melena, diarrhea, constipation, flank pain, dysuria, hematuria, urinary  Frequency, nocturia, numbness, tingling, seizures,  Focal weakness, Loss of consciousness,  Tremor, insomnia, depression, anxiety, and suicidal ideation.    Physical Exam:  BP 102/60   Pulse 80   Temp 97.9 F (36.6 C) (Oral)   Ht '5\' 3"'$  (1.6 m)   Wt 188 lb (85.3 kg)   SpO2 98%   BMI 33.30 kg/m    Physical Exam Vitals reviewed.  Constitutional:      General: She is not in acute distress.    Appearance: Normal appearance. She is well-developed and normal weight. She is not ill-appearing, toxic-appearing or diaphoretic.  HENT:     Head: Normocephalic.     Right Ear: Tympanic membrane, ear canal and external ear normal. There is no impacted cerumen.     Left Ear: Tympanic membrane, ear canal and external ear normal. There is no impacted  cerumen.     Nose: Nose normal.     Mouth/Throat:     Mouth: Mucous membranes are moist.     Pharynx: Oropharynx is clear.  Eyes:     General: No scleral icterus.       Right eye: No discharge.        Left eye: No discharge.     Conjunctiva/sclera: Conjunctivae normal.     Pupils: Pupils are equal, round, and reactive to light.  Neck:     Thyroid: No thyromegaly.     Vascular: No carotid bruit or JVD.  Cardiovascular:      Rate and Rhythm: Normal rate and regular rhythm.     Heart sounds: Normal heart sounds.  Pulmonary:     Effort: Pulmonary effort is normal. No respiratory distress.     Breath sounds: Normal breath sounds.  Chest:  Breasts:    Breasts are symmetrical.     Right: Normal. No swelling, inverted nipple, mass, nipple discharge, skin change or tenderness.     Left: Normal. No swelling, inverted nipple, mass, nipple discharge, skin change or tenderness.  Abdominal:     General: Bowel sounds are normal.     Palpations: Abdomen is soft. There is no mass.     Tenderness: There is no abdominal tenderness. There is no guarding or rebound.  Musculoskeletal:        General: Normal range of motion.     Cervical back: Normal range of motion and neck supple.  Lymphadenopathy:     Cervical: No cervical adenopathy.     Upper Body:     Right upper body: No supraclavicular, axillary or pectoral adenopathy.     Left upper body: No supraclavicular, axillary or pectoral adenopathy.  Skin:    General: Skin is warm and dry.  Neurological:     General: No focal deficit present.     Mental Status: She is alert and oriented to person, place, and time. Mental status is at baseline.  Psychiatric:        Mood and Affect: Mood normal.        Behavior: Behavior normal.        Thought Content: Thought content normal.        Judgment: Judgment normal.     Assessment and Plan: Hyperlipidemia with target LDL less than 100 Assessment & Plan: LDL and triglycerides reviewed   Current risk of CAD is <2% . She has no side effects and liver enzymes are normal.   Lab Results  Component Value Date   CHOL 221 (H) 03/19/2022   HDL 37.60 (L) 03/19/2022   LDLCALC 161 (H) 03/19/2022   LDLDIRECT 167.0 03/19/2022   TRIG 108.0 03/19/2022   CHOLHDL 6 03/19/2022      Orders: -     Lipid panel -     LDL cholesterol, direct  Impaired fasting glucose -     Hemoglobin A1c -     Comprehensive metabolic  panel  Long-term use of high-risk medication -     TSH -     CBC with Differential/Platelet  Need for hepatitis C screening test -     Hepatitis C antibody  Encounter for screening for HIV -     HIV Antibody (routine testing w rflx)  Encounter for screening mammogram for malignant neoplasm of breast -     3D Screening Mammogram, Left and Right; Future  Routine general medical examination at a health care facility Assessment & Plan: age appropriate education  and counseling updated, referrals for preventative services and immunizations addressed, dietary and smoking counseling addressed, most recent labs reviewed.  I have personally reviewed and have noted:   1) the patient's medical and social history 2) The pt's use of alcohol, tobacco, and illicit drugs 3) The patient's current medications and supplements 4) Functional ability including ADL's, fall risk, home safety risk, hearing and visual impairment 5) Diet and physical activities 6) Evidence for depression or mood disorder 7) The patient's height, weight, and BMI have been recorded in the chart   I have made referrals, and provided counseling and education based on review of the above    Long Q-T syndrome Assessment & Plan: With history of  Vfib arrest in 2009 managed at [facility]  with AICD . Genetic testing done by EP was negative for gene mutations. Continue Corguard. Patient aware of medications that can prolong QT and has annual follow up with EP Englehart and pacer check every 3 months:   Reviewed his last office note  Feb  2023:  CD working well, with battery estimate 11 years to go  Pacing atrium half the time, as usual, no shock or close calls    Exertional dyspnea Assessment & Plan: Referred to pulmonology in 2023:  encouraged to increase aerobic exercise to improve conditioning    AICD (automatic cardioverter/defibrillator) present Assessment & Plan: Due to history of V fib arrest and long QT syndrome.   Replaced in August 2020.  Pacer checks every 3 months (remote) and annual EP eval every February    Other orders -     Omeprazole; Take 1 capsule (20 mg total) by mouth daily.  Dispense: 30 capsule; Refill: 3    No follow-ups on file.  Crecencio Mc, MD

## 2022-03-19 NOTE — Patient Instructions (Addendum)
FOR THE REFLUX:  START OMEPRAZOLE  20 MG IN THE MORNING PRE FOOD BY 30 MINUTES.  ONCE SYMPTOMS ARE RESOLVED SWITCH TO 20 MG FAMOTIDINE  UP TO 2 TIMES DAILY   SLEEP ON  A WEDGE .   NO EATING WITHIN TWO HOURS OF RECLINING.  SMALLER MORE FREQUENT MEALS  FOR THE WEIGHT MANAGEMENT:  EXERCISE . 30 MINUTES DAILY .  TIME MANAGEMENT    Your annual mammogram has been ordered.  You are encouraged (required) to call to make your appointment at [place]

## 2022-03-20 LAB — HIV ANTIBODY (ROUTINE TESTING W REFLEX): HIV 1&2 Ab, 4th Generation: NONREACTIVE

## 2022-03-20 LAB — HEPATITIS C ANTIBODY: Hepatitis C Ab: NONREACTIVE

## 2022-03-20 NOTE — Assessment & Plan Note (Signed)
Referred to pulmonology in 2023:  encouraged to increase aerobic exercise to improve conditioning

## 2022-03-20 NOTE — Assessment & Plan Note (Signed)
Due to history of V fib arrest and long QT syndrome.  Replaced in August 2020.  Pacer checks every 3 months (remote) and annual EP eval every February

## 2022-03-20 NOTE — Assessment & Plan Note (Addendum)
With history of  Vfib arrest in 2009 managed at [place]  with [facility] . Genetic testing done by EP was negative for gene mutations. Continue Corguard. Patient aware of medications that can prolong QT and has annual follow up with EP Englehart and pacer check every 3 months:   Reviewed his last office note  Feb  2023:  CD working well, with battery estimate 11 years to go  Pacing atrium half the time, as usual, no shock or close calls

## 2022-03-20 NOTE — Assessment & Plan Note (Signed)

## 2022-03-20 NOTE — Assessment & Plan Note (Signed)
LDL and triglycerides reviewed   Current risk of CAD is <2% . She has no side effects and liver enzymes are normal.   Lab Results  Component Value Date   CHOL 221 (H) 03/19/2022   HDL 37.60 (L) 03/19/2022   LDLCALC 161 (H) 03/19/2022   LDLDIRECT 167.0 03/19/2022   TRIG 108.0 03/19/2022   CHOLHDL 6 03/19/2022

## 2022-03-25 ENCOUNTER — Other Ambulatory Visit: Payer: Self-pay | Admitting: Internal Medicine

## 2022-04-14 ENCOUNTER — Encounter: Payer: Self-pay | Admitting: Certified Nurse Midwife

## 2022-04-16 ENCOUNTER — Ambulatory Visit (INDEPENDENT_AMBULATORY_CARE_PROVIDER_SITE_OTHER): Payer: No Typology Code available for payment source | Admitting: Certified Nurse Midwife

## 2022-04-16 ENCOUNTER — Encounter: Payer: Self-pay | Admitting: Certified Nurse Midwife

## 2022-04-16 ENCOUNTER — Other Ambulatory Visit (HOSPITAL_COMMUNITY)
Admission: RE | Admit: 2022-04-16 | Discharge: 2022-04-16 | Disposition: A | Discharge status: Home / Self Care | Payer: No Typology Code available for payment source | Source: Ambulatory Visit | Attending: Certified Nurse Midwife | Admitting: Certified Nurse Midwife

## 2022-04-16 VITALS — BP 104/73 | HR 71 | Resp 16 | Ht 63.0 in | Wt 190.7 lb

## 2022-04-16 DIAGNOSIS — Z01419 Encounter for gynecological examination (general) (routine) without abnormal findings: Secondary | ICD-10-CM

## 2022-04-16 DIAGNOSIS — Z124 Encounter for screening for malignant neoplasm of cervix: Secondary | ICD-10-CM | POA: Diagnosis present

## 2022-04-16 NOTE — Patient Instructions (Signed)
Preventive Care 40-50 Years Old, Female Preventive care refers to lifestyle choices and visits with your health care provider that can promote health and wellness. Preventive care visits are also called wellness exams. What can I expect for my preventive care visit? Counseling Your health care provider may ask you questions about your: Medical history, including: Past medical problems. Family medical history. Pregnancy history. Current health, including: Menstrual cycle. Method of birth control. Emotional well-being. Home life and relationship well-being. Sexual activity and sexual health. Lifestyle, including: Alcohol, nicotine or tobacco, and drug use. Access to firearms. Diet, exercise, and sleep habits. Work and work environment. Sunscreen use. Safety issues such as seatbelt and bike helmet use. Physical exam Your health care provider will check your: Height and weight. These may be used to calculate your BMI (body mass index). BMI is a measurement that tells if you are at a healthy weight. Waist circumference. This measures the distance around your waistline. This measurement also tells if you are at a healthy weight and may help predict your risk of certain diseases, such as type 2 diabetes and high blood pressure. Heart rate and blood pressure. Body temperature. Skin for abnormal spots. What immunizations do I need?  Vaccines are usually given at various ages, according to a schedule. Your health care provider will recommend vaccines for you based on your age, medical history, and lifestyle or other factors, such as travel or where you work. What tests do I need? Screening Your health care provider may recommend screening tests for certain conditions. This may include: Lipid and cholesterol levels. Diabetes screening. This is done by checking your blood sugar (glucose) after you have not eaten for a while (fasting). Pelvic exam and Pap test. Hepatitis B test. Hepatitis C  test. HIV (human immunodeficiency virus) test. STI (sexually transmitted infection) testing, if you are at risk. Lung cancer screening. Colorectal cancer screening. Mammogram. Talk with your health care provider about when you should start having regular mammograms. This may depend on whether you have a family history of breast cancer. BRCA-related cancer screening. This may be done if you have a family history of breast, ovarian, tubal, or peritoneal cancers. Bone density scan. This is done to screen for osteoporosis. Talk with your health care provider about your test results, treatment options, and if necessary, the need for more tests. Follow these instructions at home: Eating and drinking  Eat a diet that includes fresh fruits and vegetables, whole grains, lean protein, and low-fat dairy products. Take vitamin and mineral supplements as recommended by your health care provider. Do not drink alcohol if: Your health care provider tells you not to drink. You are pregnant, may be pregnant, or are planning to become pregnant. If you drink alcohol: Limit how much you have to 0-1 drink a day. Know how much alcohol is in your drink. In the [place] one drink equals one 12 oz bottle of beer (355 mL), one 5 oz glass of wine (148 mL), or one 1 oz glass of hard liquor (44 mL). Lifestyle Brush your teeth every morning and night with fluoride toothpaste. Floss one time each day. Exercise for at least 30 minutes 5 or more days each week. Do not use any products that contain nicotine or tobacco. These products include cigarettes, chewing tobacco, and vaping devices, such as e-cigarettes. If you need help quitting, ask your health care provider. Do not use drugs. If you are sexually active, practice safe sex. Use a condom or other form of protection to   prevent STIs. If you do not wish to become pregnant, use a form of birth control. If you plan to become pregnant, see your health care provider for a  prepregnancy visit. Take aspirin only as told by your health care provider. Make sure that you understand how much to take and what form to take. Work with your health care provider to find out whether it is safe and beneficial for you to take aspirin daily. Find healthy ways to manage stress, such as: Meditation, yoga, or listening to music. Journaling. Talking to a trusted person. Spending time with friends and family. Minimize exposure to UV radiation to reduce your risk of skin cancer. Safety Always wear your seat belt while driving or riding in a vehicle. Do not drive: If you have been drinking alcohol. Do not ride with someone who has been drinking. When you are tired or distracted. While texting. If you have been using any mind-altering substances or drugs. Wear a helmet and other protective equipment during sports activities. If you have firearms in your house, make sure you follow all gun safety procedures. Seek help if you have been physically or sexually abused. What's next? Visit your health care provider once a year for an annual wellness visit. Ask your health care provider how often you should have your eyes and teeth checked. Stay up to date on all vaccines. This information is not intended to replace advice given to you by your health care provider. Make sure you discuss any questions you have with your health care provider. Document Revised: 08/01/2020 Document Reviewed: 08/01/2020 Elsevier Patient Education  2023 Elsevier Inc.  

## 2022-04-16 NOTE — Progress Notes (Signed)
GYNECOLOGY ANNUAL PREVENTATIVE CARE ENCOUNTER NOTE  History:     Alyssa Melendez is a 52 y.o. G42P2002 female here for a routine annual gynecologic exam.  Current complaints: none.   Denies abnormal vaginal bleeding, discharge, pelvic pain, problems with intercourse or other gynecologic concerns.     Social Relationship: married  Living: spouse  Work: [employer] office Exercise:  Smoke/Alcohol/drug use: denies use   Gynecologic History No LMP recorded (within months). (Menstrual status: IUD). Contraception: IUD, for bleeding control  Last Pap: 04/10/2021. Results were: normal with negative HPV Last mammogram: 03/19/2021. 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 prn     amoxicillin (AMOXIL) 500 MG capsule Take 500 mg by mouth. PRN BEFORE DENTAL PROCEDURES     aspirin 81 MG tablet  Take 81 mg by mouth daily.     atenolol (TENORMIN) 25 MG tablet Take 25 mg by mouth daily.     Bacillus Coagulans-Inulin (PROBIOTIC-PREBIOTIC) 1-250 BILLION-MG CAPS Take 1 capsule by mouth daily.     Cholecalciferol 2000 UNITS CAPS Take 1 capsule by mouth daily.     levonorgestrel (MIRENA) 20 MCG/DAY IUD 1 each by Intrauterine route once. INSERTED 05/06/21     omeprazole (PRILOSEC) 20 MG capsule TAKE 1 CAPSULE BY MOUTH EVERY DAY 90 capsule 1   triamcinolone cream (KENALOG) 0.1 % APPLY TO AFFECTED AREA TWICE A DAY 30 g 0   No current facility-administered medications on file prior to visit.    Allergies  Allergen Reactions   Morphine And Related     GI - vomitting    Social History:  reports that she has never smoked. She has never used smokeless tobacco. She reports current alcohol use. She reports that she does not use drugs.  Family History  Problem Relation Age of Onset   Hyperlipidemia Mother    Cancer Mother 50       melanoma   Arthritis Mother        bilateral  total knee replacements   Heart disease Father        CABG x 3    Mental illness Father        Alzheimers Dementia   Hyperlipidemia Father    Stroke Father        ich   Breast cancer Neg Hx    Ovarian cancer Neg Hx    Colon cancer Neg Hx     The following portions of the patient's history were reviewed and updated as appropriate: allergies, current medications, past family history, past medical history, past social history, past surgical history and problem list.  Review of Systems Pertinent items noted in HPI and remainder of comprehensive ROS otherwise negative.  Physical Exam:  BP 104/73   Pulse 71   Resp 16   Ht '5\' 3"'$  (1.6 m)   Wt 190 lb 11.2 oz (86.5 kg)   LMP  (Within Months)   BMI 33.78 kg/m  CONSTITUTIONAL: Well-developed, well-nourished female in no acute distress.  HENT:  Normocephalic, atraumatic, External right and left ear normal. Oropharynx is clear and moist EYES: Conjunctivae and EOM  are normal. Pupils are equal, round, and reactive to light. No scleral icterus.  NECK: Normal range of motion, supple, no masses.  Normal thyroid.  SKIN: Skin is warm and dry. No rash noted. Not diaphoretic. No erythema. No pallor. MUSCULOSKELETAL: Normal range of motion. No tenderness.  No cyanosis, clubbing, or edema.  2+ distal pulses. NEUROLOGIC: Alert and oriented to person, place, and time. Normal reflexes, muscle tone coordination.  PSYCHIATRIC: Normal mood and affect. Normal behavior. Normal judgment and thought content. CARDIOVASCULAR: Normal heart rate noted, regular rhythm RESPIRATORY: Clear to auscultation bilaterally. Effort and breath sounds normal, no problems with respiration noted. BREASTS: Symmetric in size. No masses, tenderness, skin changes, nipple drainage, or lymphadenopathy bilaterally.  ABDOMEN: Soft, no distention noted.  No tenderness, rebound or guarding.  PELVIC: Normal appearing external genitalia and urethral meatus; normal appearing vaginal mucosa and cervix.  No abnormal discharge noted.  Pap smear obtained.  Normal uterine size, no other palpable masses, no uterine or adnexal tenderness.  .   Assessment and Plan:    1Annual GYN Exam   . Screening for cervical cancer  - Cytology - PAP   Pap: Will follow up results of pap smear and manage accordingly. Mammogram : ordered by PCP Labs: cone by PCP Refills: none  Referral: none  Routine preventative health maintenance measures emphasized. Please refer to After Visit Summary for other counseling recommendations.      Philip Aspen, [facility]  [facility]

## 2022-04-18 LAB — CYTOLOGY - PAP
Comment: NEGATIVE
Diagnosis: NEGATIVE
High risk HPV: NEGATIVE

## 2022-06-10 ENCOUNTER — Ambulatory Visit
Admission: RE | Admit: 2022-06-10 | Discharge: 2022-06-10 | Disposition: A | Discharge status: Home / Self Care | Payer: No Typology Code available for payment source | Source: Ambulatory Visit | Attending: Internal Medicine | Admitting: Internal Medicine

## 2022-06-10 DIAGNOSIS — Z1231 Encounter for screening mammogram for malignant neoplasm of breast: Secondary | ICD-10-CM | POA: Diagnosis present

## 2022-07-08 ENCOUNTER — Ambulatory Visit: Payer: No Typology Code available for payment source | Admitting: Family

## 2022-07-08 ENCOUNTER — Encounter: Payer: Self-pay | Admitting: Family

## 2022-07-08 VITALS — BP 118/80 | HR 74 | Temp 97.6°F | Ht 63.0 in | Wt 194.2 lb

## 2022-07-08 DIAGNOSIS — J029 Acute pharyngitis, unspecified: Secondary | ICD-10-CM | POA: Diagnosis not present

## 2022-07-08 LAB — POCT RAPID STREP A (OFFICE): Rapid Strep A Screen: NEGATIVE

## 2022-07-08 MED ORDER — PENICILLIN V POTASSIUM 500 MG PO TABS: 500.0000 mg | ORAL_TABLET | Freq: Two times a day (BID) | ORAL | 0 refills | Status: AC

## 2022-07-08 MED ORDER — LIDOCAINE VISCOUS HCL 2 % MT SOLN: 15.0000 mL | OROMUCOSAL | 0 refills | Status: DC | PRN

## 2022-07-08 NOTE — Assessment & Plan Note (Signed)
Point-of-care strep is negative.  Collected throat culture to ensure point-of-care is accurate.  Patient has history of cardiac arrest, QT syndrome,  status post mitral valve repair.  We agreed to practice quite conservatively and if throat pain were to continue, advised her to start penicillin V.  Discussed avoidance of antibiotics which potentially prolongate QT.  If throat culture returns positive for strep, advised her to start penicillin V along with probiotics.  She will let me know how she is doing.

## 2022-07-08 NOTE — Progress Notes (Signed)
Assessment & Plan:  Sore throat -     POCT rapid strep A -     Culture, Group A Strep  Pharyngitis, unspecified etiology Assessment & Plan: Point-of-care strep is negative.  Collected throat culture to ensure point-of-care is accurate.  Patient has history of cardiac arrest, QT syndrome,  status post mitral valve repair.  We agreed to practice quite conservatively and if throat pain were to continue, advised her to start penicillin V.  Discussed avoidance of antibiotics which potentially prolongate QT.  If throat culture returns positive for strep, advised her to start penicillin V along with probiotics.  She will let me know how she is doing.   Orders: -     Lidocaine Viscous HCl; Use as directed 15 mLs in the mouth or throat every 3 (three) hours as needed for mouth pain (gargle; may spit or swallow).  Dispense: 100 mL; Refill: 0 -     Penicillin V Potassium; Take 1 tablet (500 mg total) by mouth 2 (two) times daily for 10 days. Give 1h before or 2h after meals  Dispense: 20 tablet; Refill: 0     Return precautions given.   Risks, benefits, and alternatives of the medications and treatment plan prescribed today were discussed, and patient expressed understanding.   Education regarding symptom management and diagnosis given to patient on AVS either electronically or printed.  No follow-ups on file.  Rennie Plowman, FNP  Subjective:    Patient ID: Roxy Horseman, female    DOB: 02/23/1970, 52 y.o.   MRN: [id number]  CC: Mckaylee Mandella is a 52 y.o. female who presents today for an acute visit.    HPI: Complains of bilateral sore throat 5 days, unchanged  Chills have resolved.  Pain temporary responds to ibuprofen/tylenol. She is taking ibuprofen and tylenol scheduled.   She has a sporadic cough which started last night.   Negative covid   She is using otc throat spray.   No ear pain, sinus pain , sneezing, sob, wheezing.   Exposed to son whom has had sore throat.       H/o cardiac arrest, QT syndrome, s/p mitral valve repair  She has defibrillator and pacemaker  No h/o ckd  She has follows with Dr Jamison Oka, EP, in [place]. She is compliant with atenolol 25mg .      Allergies: Morphine and codeine Current Outpatient Medications on File Prior to Visit  Medication Sig Dispense Refill   ALPRAZolam (XANAX) 0.25 MG tablet Take 0.25 mg by mouth. prn     amoxicillin (AMOXIL) 500 MG capsule Take 500 mg by mouth. PRN BEFORE DENTAL PROCEDURES     aspirin 81 MG tablet Take 81 mg by mouth daily.     atenolol (TENORMIN) 25 MG tablet Take 25 mg by mouth daily.     Bacillus Coagulans-Inulin (PROBIOTIC-PREBIOTIC) 1-250 BILLION-MG CAPS Take 1 capsule by mouth daily.     Cholecalciferol 2000 UNITS CAPS Take 1 capsule by mouth daily.     levonorgestrel (MIRENA) 20 MCG/DAY IUD 1 each by Intrauterine route once. INSERTED 05/06/21     omeprazole (PRILOSEC) 20 MG capsule TAKE 1 CAPSULE BY MOUTH EVERY DAY 90 capsule 1   triamcinolone cream (KENALOG) 0.1 % APPLY TO AFFECTED AREA TWICE A DAY 30 g 0   No current facility-administered medications on file prior to visit.    Review of Systems  Constitutional:  Negative for chills and fever.  HENT:  Positive for sore throat. Negative for congestion, sinus pressure and  sneezing.   Respiratory:  Positive for cough.   Cardiovascular:  Negative for chest pain and palpitations.  Gastrointestinal:  Negative for nausea and vomiting.      Objective:    BP 118/80   Pulse 74   Temp 97.6 F (36.4 C) (Oral)   Ht 5\' 3"  (1.6 m)   Wt 194 lb 3.2 oz (88.1 kg)   LMP  (LMP Unknown)   SpO2 98%   BMI 34.40 kg/m   BP Readings from Last 3 Encounters:  07/08/22 118/80  04/16/22 104/73  03/19/22 102/60   Wt Readings from Last 3 Encounters:  07/08/22 194 lb 3.2 oz (88.1 kg)  04/16/22 190 lb 11.2 oz (86.5 kg)  03/19/22 188 lb (85.3 kg)    Physical Exam Vitals reviewed.  Constitutional:      Appearance: She is  well-developed.  HENT:     Head: Normocephalic and atraumatic.     Right Ear: Hearing, tympanic membrane, ear canal and external ear normal. No decreased hearing noted. No drainage, swelling or tenderness. No middle ear effusion. No foreign body. Tympanic membrane is not erythematous or bulging.     Left Ear: Hearing, tympanic membrane, ear canal and external ear normal. No decreased hearing noted. No drainage, swelling or tenderness.  No middle ear effusion. No foreign body. Tympanic membrane is not erythematous or bulging.     Nose: Nose normal. No rhinorrhea.     Right Sinus: No maxillary sinus tenderness or frontal sinus tenderness.     Left Sinus: No maxillary sinus tenderness or frontal sinus tenderness.     Mouth/Throat:     Pharynx: Uvula midline. Posterior oropharyngeal erythema present. No oropharyngeal exudate.     Tonsils: No tonsillar abscesses.  Eyes:     Conjunctiva/sclera: Conjunctivae normal.  Cardiovascular:     Rate and Rhythm: Regular rhythm.     Pulses: Normal pulses.     Heart sounds: Normal heart sounds.  Pulmonary:     Effort: Pulmonary effort is normal.     Breath sounds: Normal breath sounds. No wheezing, rhonchi or rales.  Lymphadenopathy:     Head:     Right side of head: No submental, submandibular, tonsillar, preauricular, posterior auricular or occipital adenopathy.     Left side of head: No submental, submandibular, tonsillar, preauricular, posterior auricular or occipital adenopathy.     Cervical: No cervical adenopathy.  Skin:    General: Skin is warm and dry.  Neurological:     Mental Status: She is alert.  Psychiatric:        Speech: Speech normal.        Behavior: Behavior normal.        Thought Content: Thought content normal.

## 2022-07-08 NOTE — Patient Instructions (Addendum)
As discussed, we will await throat culture to ensure no strep.   If strep positive or symptoms persist, please go ahead and start penicillin V.  Ensure to take probiotics while on antibiotics and also for 2 weeks after completion. This can either be by eating yogurt daily or taking a probiotic supplement over the counter such as Culturelle.It is important to re-colonize the gut with good bacteria and also to prevent any diarrheal infections associated with antibiotic use.   Nice to meet you.

## 2022-07-10 LAB — CULTURE, GROUP A STREP
MICRO NUMBER:: 14984546
SPECIMEN QUALITY:: ADEQUATE

## 2022-09-17 ENCOUNTER — Ambulatory Visit: Payer: No Typology Code available for payment source | Admitting: Internal Medicine

## 2022-09-23 ENCOUNTER — Ambulatory Visit: Payer: Self-pay | Admitting: Internal Medicine

## 2022-11-04 ENCOUNTER — Encounter: Payer: Self-pay | Admitting: Family

## 2022-11-24 ENCOUNTER — Encounter: Payer: Self-pay | Admitting: Internal Medicine

## 2022-12-25 ENCOUNTER — Other Ambulatory Visit (HOSPITAL_COMMUNITY): Payer: Self-pay

## 2022-12-25 ENCOUNTER — Other Ambulatory Visit
Admission: RE | Admit: 2022-12-25 | Discharge: 2022-12-25 | Disposition: A | Discharge status: Home / Self Care | Payer: 59 | Source: Ambulatory Visit | Attending: Internal Medicine | Admitting: Internal Medicine

## 2022-12-25 ENCOUNTER — Ambulatory Visit: Payer: 59 | Admitting: Internal Medicine

## 2022-12-25 ENCOUNTER — Encounter: Payer: Self-pay | Admitting: Internal Medicine

## 2022-12-25 VITALS — BP 120/78 | HR 80 | Wt 193.1 lb

## 2022-12-25 DIAGNOSIS — I341 Nonrheumatic mitral (valve) prolapse: Secondary | ICD-10-CM

## 2022-12-25 DIAGNOSIS — R0602 Shortness of breath: Secondary | ICD-10-CM | POA: Diagnosis not present

## 2022-12-25 DIAGNOSIS — Z8674 Personal history of sudden cardiac arrest: Secondary | ICD-10-CM | POA: Diagnosis not present

## 2022-12-25 DIAGNOSIS — E669 Obesity, unspecified: Secondary | ICD-10-CM | POA: Insufficient documentation

## 2022-12-25 DIAGNOSIS — E782 Mixed hyperlipidemia: Secondary | ICD-10-CM

## 2022-12-25 DIAGNOSIS — I4581 Long QT syndrome: Secondary | ICD-10-CM

## 2022-12-25 DIAGNOSIS — Z6834 Body mass index (BMI) 34.0-34.9, adult: Secondary | ICD-10-CM | POA: Insufficient documentation

## 2022-12-25 DIAGNOSIS — E663 Overweight: Secondary | ICD-10-CM

## 2022-12-25 DIAGNOSIS — E785 Hyperlipidemia, unspecified: Secondary | ICD-10-CM | POA: Insufficient documentation

## 2022-12-25 LAB — BASIC METABOLIC PANEL
Anion gap: 6 (ref 5–15)
BUN: 17 mg/dL (ref 6–20)
CO2: 26 mmol/L (ref 22–32)
Calcium: 8.6 mg/dL — ABNORMAL LOW (ref 8.9–10.3)
Chloride: 102 mmol/L (ref 98–111)
Creatinine, Ser: 1.14 mg/dL — ABNORMAL HIGH (ref 0.44–1.00)
GFR, Estimated: 59 mL/min — ABNORMAL LOW (ref >60)
Glucose, Bld: 88 mg/dL (ref 70–99)
Potassium: 3.7 mmol/L (ref 3.5–5.1)
Sodium: 134 mmol/L — ABNORMAL LOW (ref 135–145)

## 2022-12-25 LAB — BRAIN NATRIURETIC PEPTIDE: B Natriuretic Peptide: 95.8 pg/mL (ref 0.0–100.0)

## 2022-12-25 MED ORDER — ROSUVASTATIN CALCIUM 5 MG PO TABS
5.0000 mg | ORAL_TABLET | Freq: Every day | ORAL | 3 refills | Status: DC
  Filled 2023-01-20: qty 90, 90d supply, fill #0
  Filled 2023-04-16: qty 90, 90d supply, fill #1
  Filled 2023-06-28 – 2023-07-03 (×2): qty 90, 90d supply, fill #2
  Filled 2023-11-19: qty 60, 60d supply, fill #3

## 2022-12-25 NOTE — Patient Instructions (Signed)
START Crestor 5mg  daily  Routine lab work today. Will notify you of abnormal results  Your provider requests you have an echocardiogram (We will call you to schedule that appointment)  Follow up with Dr.Bensimhon in 4 months (We will call you to schedule that appointment)

## 2022-12-25 NOTE — Progress Notes (Signed)
ADVANCED HF CLINIC CONSULT NOTE  Referring Physician: Sherlene Shams, MD Primary Care: Sherlene Shams, MD Primary Cardiologist: Dr. 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 Cath in 2013 with normal coronaries.   Had on shock for VT when giving a talk at church   Etiology of VF never ascertained (? Long QT - QT on presentation with VF but since normalized)  Genetic testing in 2009 (Dr. Jaquita Folds at [facility]) - negative testing Repeat Genetic testing with Lars Masson ([facility]) in 2016 - negative. Saw Dr. 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 Charolotte Eke (EP at [facility]) in 2022  Denies snoring.   Here for initial visit. Works as Designer, jewellery at [employer]. Active and does well but remains SOB with incline (1 set of stairs). No edema, orthopnea or PND. Takes Atenolol prior to stressful events. Walks 3x/week for 30 mins. Trying to lose weight. No syncope/presyncope.    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 PRN BEFORE DENTAL PROCEDURES     aspirin 81 MG tablet Take 81 mg by mouth daily.     atenolol (TENORMIN) 25 MG tablet Take 25 mg by mouth daily.     Cholecalciferol 2000  UNITS CAPS Take 1 capsule by mouth daily.     Coenzyme Q10 (COQ10) 100 MG CAPS Take 100 mg by mouth daily.     famotidine (PEPCID) 20 MG tablet Take 20 mg by mouth 2 (two) times daily.     levonorgestrel (MIRENA) 20 MCG/DAY IUD 1 each by Intrauterine route once. INSERTED 05/06/21     No current facility-administered medications for this visit.    Allergies  Allergen Reactions   Morphine And Codeine     GI - vomitting      Social History   Socioeconomic History   Marital status: Married    Spouse name: Not on file   Number of children: Not on file   Years of education: Not on file   Highest education level: Not on file  Occupational History   Not on file  Tobacco Use   Smoking status: Never   Smokeless tobacco: Never  Vaping Use   Vaping status: Never Used  Substance and Sexual Activity   Alcohol use: Yes    Comment: occassionally   Drug use: No   Sexual activity: Yes    Birth control/protection: None    Comment: vasectomy  Other Topics Concern   Not on file  Social History Narrative   ** Merged History Encounter **       Social Determinants of Health   Financial Resource Strain: Not on file  Food Insecurity: Not on file  Transportation Needs: Not on file  Physical Activity: Sufficiently Active (08/27/2017)   Exercise Vital Sign    Days of Exercise per Week: 5 days    Minutes of Exercise per Session: 30 min  Stress: Not on file  Social Connections: Not on file  Intimate Partner Violence: Not on file      Family History  Problem Relation Age of Onset   Hyperlipidemia Mother    Cancer Mother 55       melanoma   Arthritis Mother        bilateral total knee replacements   Heart disease Father        CABG x 3    Mental illness Father        Alzheimers Dementia   Hyperlipidemia Father    Stroke Father        ich   Breast cancer Neg Hx    Ovarian cancer Neg Hx    Colon cancer Neg Hx     Vitals:   12/25/22 1519  BP: 120/78  Pulse: 80  SpO2: 99%   Weight: 193 lb 2 oz (87.6 kg)    PHYSICAL EXAM: General:  Well appearing. No respiratory difficulty HEENT: normal Neck: supple. no JVD. Carotids 2+ bilat; no bruits. No lymphadenopathy or thryomegaly appreciated. Cor: PMI nondisplaced. Regular rate & rhythm. No rubs, gallops or murmurs. Lungs: clear Abdomen: soft, nontender, nondistended. No hepatosplenomegaly. No bruits or masses. Good bowel sounds. Extremities: no cyanosis, clubbing, rash, edema Neuro: alert & oriented x 3, cranial nerves grossly intact. moves all 4 extremities w/o difficulty. Affect pleasant.  ECG: Normal sinus rhythm with periods of a-pacing 74. Non-specific STs. QTc 399 ms Personally reviewed   ASSESSMENT & PLAN:  1. H/o VF arrest 2009 - Etiology unclear ? 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 Graciela Husbands and can consider switching b-blocker to nadolol or inderal - Avoid competitive sports  2. MVP with Barlow's valve s/p MV repair ([facility] 2013) - stable - due for repeat echo  - continue SBE prophylaxis  3. Obesity - discussed potential GLP1RA - I had her meet with PharmD and insurance will not cover  4. Screening for CAD - previous cath no CAD in 2013 - Consider CAC  5. Hyperlipdemia - start crestor  Body mass index is 34.21 kg/m.   Arvilla Meres, MD  3:40 PM

## 2023-01-02 ENCOUNTER — Other Ambulatory Visit: Payer: Self-pay

## 2023-01-05 ENCOUNTER — Other Ambulatory Visit: Payer: Self-pay

## 2023-01-05 MED ORDER — ATENOLOL 25 MG PO TABS
25.0000 mg | ORAL_TABLET | Freq: Every day | ORAL | 3 refills | Status: DC
  Filled 2023-01-20: qty 90, 90d supply, fill #0
  Filled 2023-04-16: qty 60, 60d supply, fill #1

## 2023-01-05 MED ORDER — OMEPRAZOLE 20 MG PO CPDR
20.0000 mg | DELAYED_RELEASE_CAPSULE | Freq: Every day | ORAL | 0 refills | Status: DC
  Filled 2023-01-05: qty 60, 60d supply, fill #0

## 2023-01-06 ENCOUNTER — Other Ambulatory Visit: Payer: Self-pay

## 2023-01-19 ENCOUNTER — Other Ambulatory Visit: Payer: Self-pay

## 2023-01-20 ENCOUNTER — Other Ambulatory Visit: Payer: Self-pay | Admitting: Internal Medicine

## 2023-01-21 ENCOUNTER — Other Ambulatory Visit: Payer: Self-pay

## 2023-01-22 ENCOUNTER — Other Ambulatory Visit: Payer: Self-pay

## 2023-01-22 MED ORDER — COQ10 100 MG PO CAPS
100.0000 mg | ORAL_CAPSULE | Freq: Every day | ORAL | 5 refills | Status: DC
  Filled 2023-01-22: qty 30, 30d supply, fill #0
  Filled 2023-02-20: qty 30, 30d supply, fill #1
  Filled 2023-04-16: qty 30, 30d supply, fill #2
  Filled 2023-05-26: qty 30, 30d supply, fill #3
  Filled 2023-06-28: qty 30, 30d supply, fill #4
  Filled 2023-08-06: qty 30, 30d supply, fill #5

## 2023-01-30 ENCOUNTER — Ambulatory Visit
Admission: RE | Admit: 2023-01-30 | Discharge: 2023-01-30 | Disposition: A | Discharge status: Home / Self Care | Payer: 59 | Source: Ambulatory Visit | Attending: Internal Medicine | Admitting: Internal Medicine

## 2023-01-30 DIAGNOSIS — I469 Cardiac arrest, cause unspecified: Secondary | ICD-10-CM | POA: Insufficient documentation

## 2023-01-30 DIAGNOSIS — I341 Nonrheumatic mitral (valve) prolapse: Secondary | ICD-10-CM

## 2023-01-30 DIAGNOSIS — I081 Rheumatic disorders of both mitral and tricuspid valves: Secondary | ICD-10-CM | POA: Insufficient documentation

## 2023-01-30 LAB — ECHOCARDIOGRAM COMPLETE
AR max vel: 2.98 cm2
AV Area VTI: 3.63 cm2
AV Area mean vel: 3 cm2
AV Mean grad: 2 mm[Hg]
AV Peak grad: 3.3 mm[Hg]
Ao pk vel: 0.91 m/s
Area-P 1/2: 5.62 cm2
MV VTI: 2.03 cm2
S' Lateral: 3.1 cm

## 2023-01-30 NOTE — Progress Notes (Signed)
*  PRELIMINARY RESULTS* Echocardiogram 2D Echocardiogram has been performed.  Alyssa Melendez 01/30/2023, 9:42 AM

## 2023-02-15 ENCOUNTER — Telehealth: Payer: 59 | Admitting: Family

## 2023-02-15 DIAGNOSIS — J209 Acute bronchitis, unspecified: Secondary | ICD-10-CM | POA: Diagnosis not present

## 2023-02-15 MED ORDER — PREDNISONE 10 MG (21) PO TBPK
ORAL_TABLET | ORAL | 0 refills | Status: DC
Start: 2023-02-15 — End: 2023-03-24

## 2023-02-15 MED ORDER — BENZONATATE 100 MG PO CAPS: 100.0000 mg | ORAL_CAPSULE | Freq: Three times a day (TID) | ORAL | 0 refills | Status: DC | PRN

## 2023-02-15 NOTE — Progress Notes (Signed)
Virtual Visit Consent   Alyssa Melendez, you are scheduled for a virtual visit with a [facility] provider today. Just as with appointments in the office, your consent must be obtained to participate. Your consent will be active for this visit and any virtual visit you may have with one of our providers in the next 365 days. If you have a MyChart account, a copy of this consent can be sent to you electronically.  As this is a virtual visit, video technology does not allow for your provider to perform a traditional examination. This may limit your provider's ability to fully assess your condition. If your provider identifies any concerns that need to be evaluated in person or the need to arrange testing (such as labs, EKG, etc.), we will make arrangements to do so. Although advances in technology are sophisticated, we cannot ensure that it will always work on either your end or our end. If the connection with a video visit is poor, the visit may have to be switched to a telephone visit. With either a video or telephone visit, we are not always able to ensure that we have a secure connection.  By engaging in this virtual visit, you consent to the provision of healthcare and authorize for your insurance to be billed (if applicable) for the services provided during this visit. Depending on your insurance coverage, you may receive a charge related to this service.  I need to obtain your verbal consent now. Are you willing to proceed with your visit today? Alyssa Melendez has provided verbal consent on 02/15/2023 for a virtual visit (video or telephone). Alyssa Rodney, FNP  Date: 02/15/2023 11:24 AM  Virtual Visit via Video Note   I, Alyssa Melendez, connected with  Alyssa Melendez  ([id number], 1970/07/25) on 02/15/23 at 11:15 AM EST by a video-enabled telemedicine application and verified that I am speaking with the correct person using two identifiers.  Location: Patient: Virtual Visit Location Patient:  Home Provider: Virtual Visit Location Provider: Home Office   I discussed the limitations of evaluation and management by telemedicine and the availability of in person appointments. The patient expressed understanding and agreed to proceed.    History of Present Illness: Alyssa Melendez is a 52 y.o. who identifies as a female who was assigned female at birth, and is being seen today for cough and congestion that started over a week ago.  HPI: Cough This is a new problem. The current episode started 1 to 4 weeks ago. The problem has been gradually worsening. The problem occurs every few minutes. The cough is Non-productive. Pertinent negatives include no chills, ear congestion, ear pain, fever, headaches, myalgias, nasal congestion, shortness of breath or wheezing. She has tried rest and OTC cough suppressant for the symptoms. The treatment provided mild relief.    Problems:  Patient Active Problem List   Diagnosis Date Noted   Pharyngitis 07/08/2022   Exertional dyspnea 03/13/2021   Herpes zoster 01/18/2016   AICD (automatic cardioverter/defibrillator) present 12/20/2015   Pes anserine bursitis 06/18/2015   Vitamin D deficiency 11/29/2014   Hyperlipidemia with target LDL less than 100 04/11/2014   Overweight 04/11/2014   Carpal tunnel syndrome 11/02/2013   Breast calcifications on mammogram 09/21/2013   Routine general medical examination at a health care facility 02/22/2013   Plantar fasciitis, bilateral 02/21/2013   Hirsutism 02/16/2012   Hx-sudden cardiac arrest    Long Q-T syndrome    Mitral valve prolapse     Allergies:  Allergies  Allergen  Reactions   Morphine And Codeine     GI - vomitting   Medications:  Current Outpatient Medications:    benzonatate (TESSALON PERLES) 100 MG capsule, Take 1 capsule (100 mg total) by mouth 3 (three) times daily as needed., Disp: 20 capsule, Rfl: 0   predniSONE (STERAPRED UNI-PAK 21 TAB) 10 MG (21) TBPK tablet, Use as directed, Disp: 21  tablet, Rfl: 0   amoxicillin (AMOXIL) 500 MG capsule, Take 500 mg by mouth. PRN BEFORE DENTAL PROCEDURES, Disp: , Rfl:    aspirin 81 MG tablet, Take 81 mg by mouth daily., Disp: , Rfl:    atenolol (TENORMIN) 25 MG tablet, Take 1 tablet (25 mg total) by mouth daily., Disp: 90 tablet, Rfl: 3   Cholecalciferol 2000 UNITS CAPS, Take 1 capsule by mouth daily., Disp: , Rfl:    Coenzyme Q10 (COQ10) 100 MG CAPS, Take 100 mg by mouth daily., Disp: 30 capsule, Rfl: 5   famotidine (PEPCID) 20 MG tablet, Take 20 mg by mouth 2 (two) times daily., Disp: , Rfl:    levonorgestrel (MIRENA) 20 MCG/DAY IUD, 1 each by Intrauterine route once. INSERTED 05/06/21, Disp: , Rfl:    omeprazole (PRILOSEC) 20 MG capsule, Take 1 capsule (20 mg total) by mouth daily., Disp: 60 capsule, Rfl: 0   rosuvastatin (CRESTOR) 5 MG tablet, Take 1 tablet (5 mg total) by mouth daily., Disp: 90 tablet, Rfl: 3  Observations/Objective: Patient is well-developed, well-nourished in no acute distress.  Resting comfortably  at home.  Head is normocephalic, atraumatic.  No labored breathing.  Speech is clear and coherent with logical content.  Patient is alert and oriented at baseline.  Dry nonproductive   Assessment and Plan: 1. Acute bronchitis, unspecified organism (Primary) - predniSONE (STERAPRED UNI-PAK 21 TAB) 10 MG (21) TBPK tablet; Use as directed  Dispense: 21 tablet; Refill: 0 - benzonatate (TESSALON PERLES) 100 MG capsule; Take 1 capsule (100 mg total) by mouth 3 (three) times daily as needed.  Dispense: 20 capsule; Refill: 0  - Take meds as prescribed - Use a cool mist humidifier  -Use saline nose sprays frequently -Force fluids -For any cough or congestion  Use plain Mucinex- regular strength or max strength is fine -For fever or aces or pains- take tylenol or ibuprofen. -Throat lozenges if help -Follow up if symptoms worsen or do not improve   Follow Up Instructions: I discussed the assessment and treatment plan  with the patient. The patient was provided an opportunity to ask questions and all were answered. The patient agreed with the plan and demonstrated an understanding of the instructions.  A copy of instructions were sent to the patient via MyChart unless otherwise noted below.     The patient was advised to call back or seek an in-person evaluation if the symptoms worsen or if the condition fails to improve as anticipated.    Alyssa Rodney, FNP

## 2023-02-20 ENCOUNTER — Encounter: Payer: Self-pay | Admitting: Nurse Practitioner

## 2023-02-20 ENCOUNTER — Ambulatory Visit: Payer: 59 | Admitting: Nurse Practitioner

## 2023-02-20 VITALS — BP 96/68 | HR 70 | Temp 98.0°F | Resp 18 | Ht 63.0 in | Wt 191.5 lb

## 2023-02-20 DIAGNOSIS — J209 Acute bronchitis, unspecified: Secondary | ICD-10-CM | POA: Insufficient documentation

## 2023-02-20 DIAGNOSIS — J42 Unspecified chronic bronchitis: Secondary | ICD-10-CM | POA: Insufficient documentation

## 2023-02-20 MED ORDER — HYDROCODONE BIT-HOMATROP MBR 5-1.5 MG/5ML PO SOLN: 5.0000 mL | Freq: Every evening | ORAL | 0 refills | Status: DC | PRN

## 2023-02-20 MED ORDER — AMOXICILLIN-POT CLAVULANATE 875-125 MG PO TABS: 1.0000 | ORAL_TABLET | Freq: Two times a day (BID) | ORAL | 0 refills | Status: AC

## 2023-02-20 NOTE — Progress Notes (Signed)
 Established Patient Office Visit  Subjective:  Patient ID: Alyssa Melendez, female    DOB: 09-09-1970  Age: 53 y.o. MRN: [id number]  CC:  Chief Complaint  Patient presents with   URI    Since Thanksgiving    HPI  Alyssa Melendez presents for persistent upper respiratory infection that began around Thanksgiving. The primary symptom is a severe, non-productive cough that is triggered by air hitting the throat, making it difficult to speak. She had e-visit on 12/29  and was started on prednisone  tapering and tessalon  perls.  However, the patient experienced adverse effects from the prednisone , including a pounding heart rate and a significant drop in blood pressure, leading to symptoms of lightheadedness upon standing and ringing in the ears when lying down. Due to these side effects, the patient discontinued the prednisone . The patient also reports that the prednisone  did not alleviate the upper respiratory symptoms.  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      Past Medical History:  Diagnosis Date   Barlow syndrome    Cardiac arrest (HCC) 11/22/2012   Complex ovarian cyst    Congestive heart failure (HCC)    Fibroadenoma    Hx-sudden cardiac arrest 2009   secondary to Long QT Syndrome   Increased BMI    LLQ pain    Long Q-T syndrome 2009   with Sudden Cardiac arrest   Mitral valve prolapse    OAB (overactive bladder)     Past Surgical History:  Procedure Laterality Date   BREAST BIOPSY Left    Negative 10+years ago   BREAST EXCISIONAL BIOPSY Left    fibroadenoma over 10 years ago   BREAST SURGERY  2001   Benign   CARDIAC DEFIBRILLATOR PLACEMENT  2009   CARDIAC DEFIBRILLATOR PLACEMENT  June 2013   CESAREAN SECTION  2007 and 2005   MITRAL VALVE REPAIR      Family History  Problem Relation Age of Onset    Hyperlipidemia Mother    Cancer Mother 70       melanoma   Arthritis Mother        bilateral total knee replacements   Heart disease Father        CABG x 3    Mental illness Father        Alzheimers Dementia   Hyperlipidemia Father    Stroke Father        ich   Breast cancer Neg Hx    Ovarian cancer Neg Hx    Colon cancer Neg Hx     Social History   Socioeconomic History   Marital status: Married    Spouse name: Not on file   Number of children: Not on file   Years of education: Not on file   Highest education level: Not on file  Occupational History   Not on file  Tobacco Use   Smoking status: Never   Smokeless tobacco: Never  Vaping Use   Vaping status: Never Used  Substance and Sexual Activity   Alcohol use: Yes    Comment: occassionally   Drug use: No   Sexual activity: Yes    Birth control/protection: None    Comment: vasectomy  Other Topics Concern   Not on file  Social History Narrative   ** Merged History Encounter **       Social Drivers  of Health   Financial Resource Strain: Not on file  Food Insecurity: Not on file  Transportation Needs: Not on file  Physical Activity: Sufficiently Active (08/27/2017)   Exercise Vital Sign    Days of Exercise per Week: 5 days    Minutes of Exercise per Session: 30 min  Stress: Not on file  Social Connections: Not on file  Intimate Partner Violence: Not on file     Outpatient Medications Prior to Visit  Medication Sig Dispense Refill   amoxicillin  (AMOXIL ) 500 MG capsule Take 500 mg by mouth. PRN BEFORE DENTAL PROCEDURES     aspirin 81 MG tablet Take 81 mg by mouth daily.     atenolol  (TENORMIN ) 25 MG tablet Take 1 tablet (25 mg total) by mouth daily. 90 tablet 3   benzonatate  (TESSALON  PERLES) 100 MG capsule Take 1 capsule (100 mg total) by mouth 3 (three) times daily as needed. 20 capsule 0   Cholecalciferol 2000 UNITS CAPS Take 1 capsule by mouth daily.     Coenzyme Q10 (COQ10) 100 MG CAPS Take 100 mg by  mouth daily. 30 capsule 5   famotidine  (PEPCID ) 20 MG tablet Take 20 mg by mouth 2 (two) times daily.     levonorgestrel  (MIRENA ) 20 MCG/DAY IUD 1 each by Intrauterine route once. INSERTED 05/06/21     omeprazole  (PRILOSEC) 20 MG capsule Take 1 capsule (20 mg total) by mouth daily. 60 capsule 0   predniSONE  (STERAPRED UNI-PAK 21 TAB) 10 MG (21) TBPK tablet Use as directed 21 tablet 0   rosuvastatin  (CRESTOR ) 5 MG tablet Take 1 tablet (5 mg total) by mouth daily. 90 tablet 3   No facility-administered medications prior to visit.    Allergies  Allergen Reactions   Morphine And Codeine     GI - vomitting    ROS Review of Systems Negative unless indicated in HPI.    Objective:    Physical Exam HENT:     Right Ear: Tympanic membrane normal. Tympanic membrane is not erythematous.     Left Ear: Tympanic membrane normal. Tympanic membrane is not erythematous.     Nose:     Right Turbinates: Not enlarged.     Left Turbinates: Not enlarged.     Right Sinus: No maxillary sinus tenderness or frontal sinus tenderness.     Left Sinus: No maxillary sinus tenderness or frontal sinus tenderness.     Mouth/Throat:     Mouth: Mucous membranes are moist.     Pharynx: Postnasal drip present. No pharyngeal swelling, oropharyngeal exudate or posterior oropharyngeal erythema.     Tonsils: No tonsillar exudate.  Cardiovascular:     Rate and Rhythm: Normal rate and regular rhythm.  Pulmonary:     Effort: Pulmonary effort is normal.     Breath sounds: Normal breath sounds. No stridor. No wheezing.  Neurological:     General: No focal deficit present.     Mental Status: She is oriented to person, place, and time. Mental status is at baseline.  Psychiatric:        Mood and Affect: Mood normal.        Behavior: Behavior normal.        Thought Content: Thought content normal.        Judgment: Judgment normal.     BP 96/68   Pulse 70   Temp 98 F (36.7 C) (Oral)   Resp 18   Ht 5' 3 (1.6 m)    Wt 191 lb 8 oz (  86.9 kg)   SpO2 99%   BMI 33.92 kg/m  Wt Readings from Last 3 Encounters:  02/20/23 191 lb 8 oz (86.9 kg)  12/25/22 193 lb 2 oz (87.6 kg)  07/08/22 194 lb 3.2 oz (88.1 kg)     Health Maintenance  Topic Date Due   Zoster Vaccines- Shingrix (1 of 2) Never done   INFLUENZA VACCINE  09/18/2022   COVID-19 Vaccine (4 - 2024-25 season) 10/19/2022   Fecal DNA (Cologuard)  04/16/2023   MAMMOGRAM  06/09/2024   Cervical Cancer Screening (HPV/Pap Cotest)  04/17/2027   DTaP/Tdap/Td (2 - Td or Tdap) 01/31/2028   Hepatitis C Screening  Completed   HIV Screening  Completed   HPV VACCINES  Aged Out    There are no preventive care reminders to display for this patient.  Lab Results  Component Value Date   TSH 1.35 03/19/2022   Lab Results  Component Value Date   WBC 5.1 03/19/2022   HGB 14.4 03/19/2022   HCT 42.1 03/19/2022   MCV 91.0 03/19/2022   PLT 277.0 03/19/2022   Lab Results  Component Value Date   NA 134 (L) 12/25/2022   K 3.7 12/25/2022   CO2 26 12/25/2022   GLUCOSE 88 12/25/2022   BUN 17 12/25/2022   CREATININE 1.14 (H) 12/25/2022   BILITOT 0.6 03/19/2022   ALKPHOS 60 03/19/2022   AST 14 03/19/2022   ALT 12 03/19/2022   PROT 6.8 03/19/2022   ALBUMIN 4.4 03/19/2022   CALCIUM  8.6 (L) 12/25/2022   ANIONGAP 6 12/25/2022   GFR 70.31 03/19/2022   Lab Results  Component Value Date   CHOL 221 (H) 03/19/2022   Lab Results  Component Value Date   HDL 37.60 (L) 03/19/2022   Lab Results  Component Value Date   LDLCALC 161 (H) 03/19/2022   Lab Results  Component Value Date   TRIG 108.0 03/19/2022   Lab Results  Component Value Date   CHOLHDL 6 03/19/2022   Lab Results  Component Value Date   HGBA1C 5.8 03/19/2022      Assessment & Plan:  Acute bronchitis, unspecified organism Assessment & Plan: Persistent dry cough since Thanksgiving, not productive, no sore throat. Prednisone  pack was poorly tolerated due to increased heart rate and  low blood pressure. -Start Augmentin . -Advise to use cough drops, salt water gargles, and honey with cinnamon and lime for symptomatic relief. -Trial of Hycodan at night for cough suppression.  Orders: -     HYDROcodone  Bit-Homatrop MBr; Take 5 mLs by mouth at bedtime as needed for cough.  Dispense: 40 mL; Refill: 0 -     Amoxicillin -Pot Clavulanate; Take 1 tablet by mouth 2 (two) times daily for 7 days.  Dispense: 14 tablet; Refill: 0    Follow-up: Return if symptoms worsen or fail to improve.   Melanni Benway, NP

## 2023-02-21 NOTE — Assessment & Plan Note (Signed)
 Persistent dry cough since Thanksgiving, not productive, no sore throat. Prednisone  pack was poorly tolerated due to increased heart rate and low blood pressure. -Start Augmentin . -Advise to use cough drops, salt water gargles, and honey with cinnamon and lime for symptomatic relief. -Trial of Hycodan at night for cough suppression.

## 2023-02-21 NOTE — Patient Instructions (Addendum)
 You will start taking Augmentin to help fight the infection. For symptomatic relief, use cough drops, salt water gargles, and honey with cinnamon and lime. Additionally, you can try Hycodan at night to help suppress the cough.

## 2023-02-27 ENCOUNTER — Other Ambulatory Visit (HOSPITAL_COMMUNITY): Payer: Self-pay

## 2023-02-27 ENCOUNTER — Other Ambulatory Visit: Payer: Self-pay

## 2023-02-27 ENCOUNTER — Encounter (HOSPITAL_COMMUNITY): Payer: Self-pay

## 2023-02-27 IMAGING — US US BREAST*R* LIMITED INC AXILLA
1 series · 5 of 5 positions shown · non-contrast
Comparison: Previous exam(s).

CLINICAL DATA: Screening recall for a possible right breast mass.

EXAM:
DIGITAL DIAGNOSTIC UNILATERAL RIGHT MAMMOGRAM WITH TOMOSYNTHESIS AND
CAD; ULTRASOUND RIGHT BREAST LIMITED
TECHNIQUE: Right digital diagnostic mammography and breast tomosynthesis was
performed. The images were evaluated with computer-aided detection.;
Targeted ultrasound examination of the right breast was performed

[Series 1: us breast*right* limited inc axilla · 0.08mm/px · 5 of 5 slices shown]
[im 1/5]
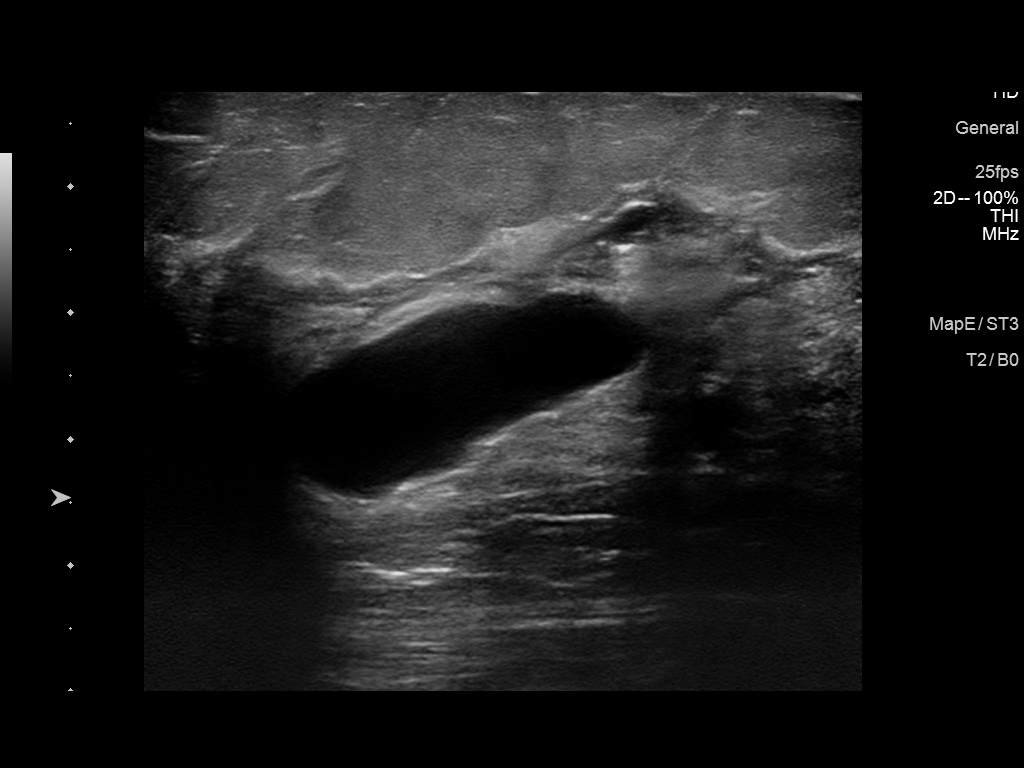
[im 2/5]
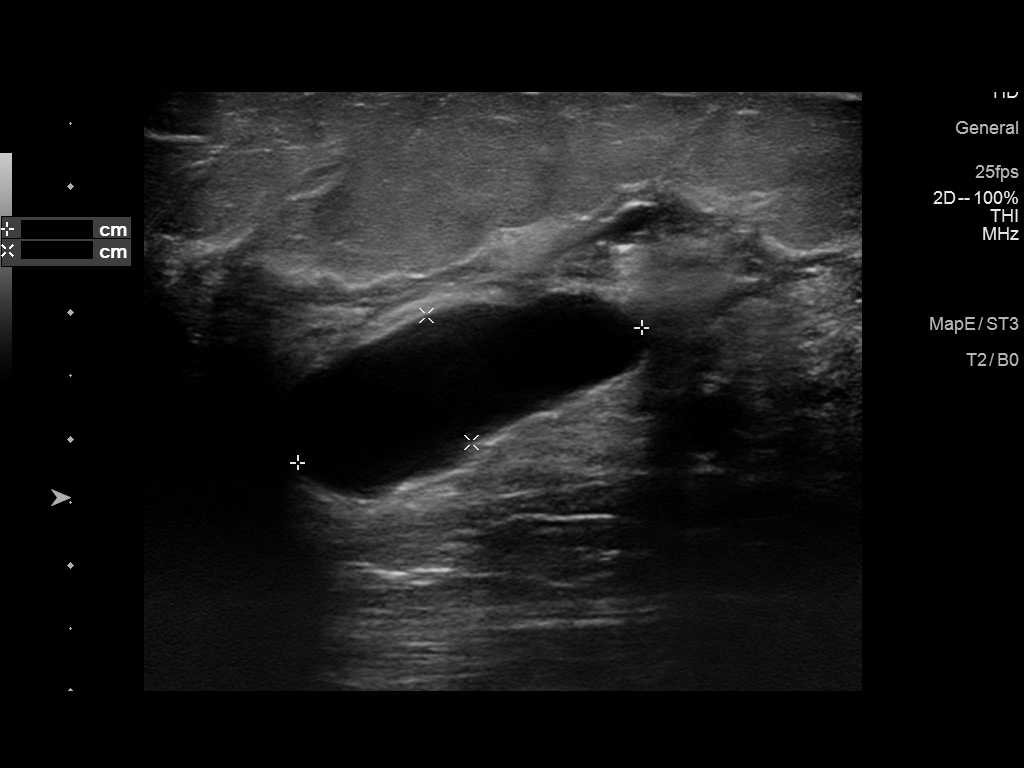
[im 3/5]
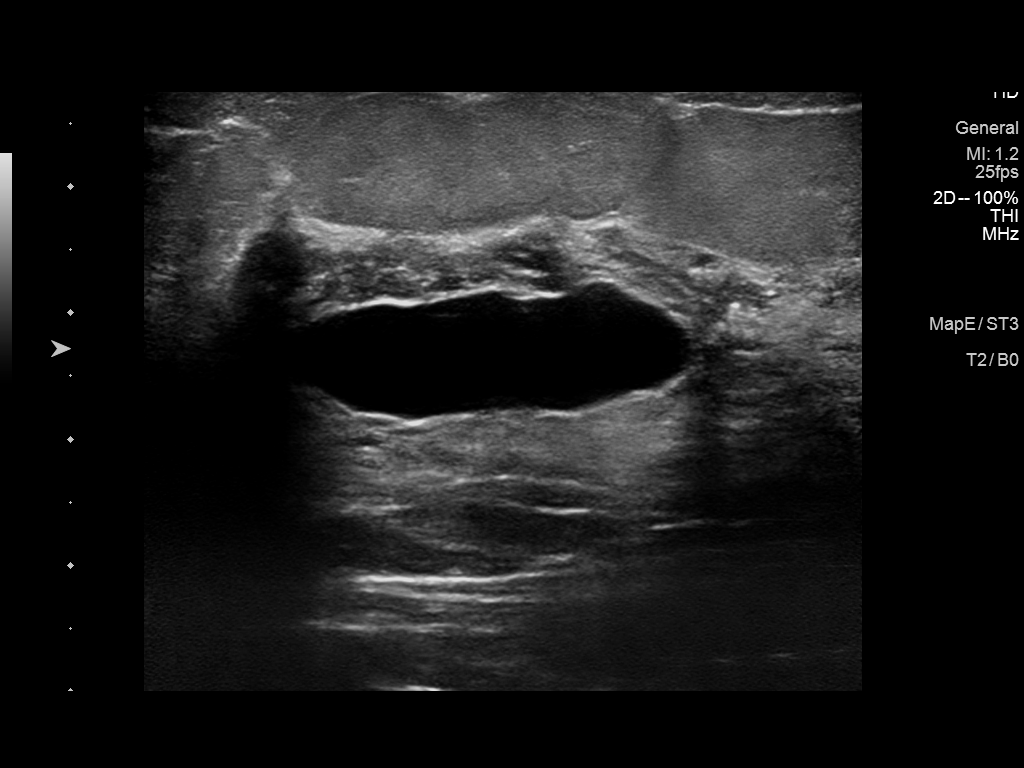
[im 4/5]
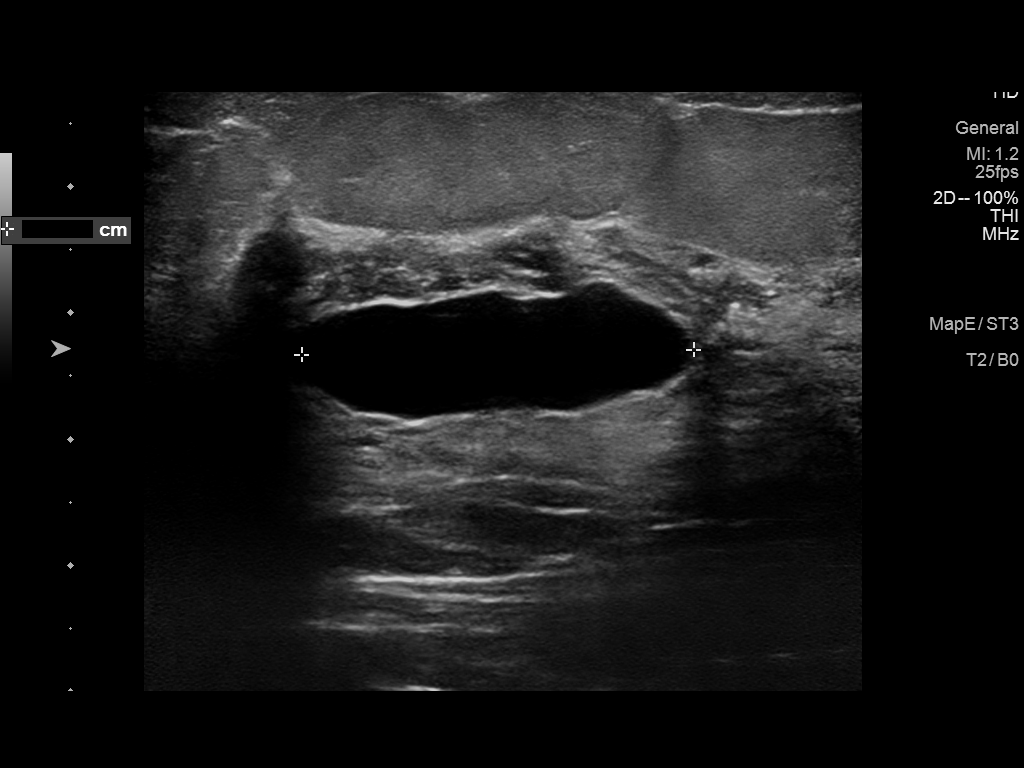
[im 5/5]
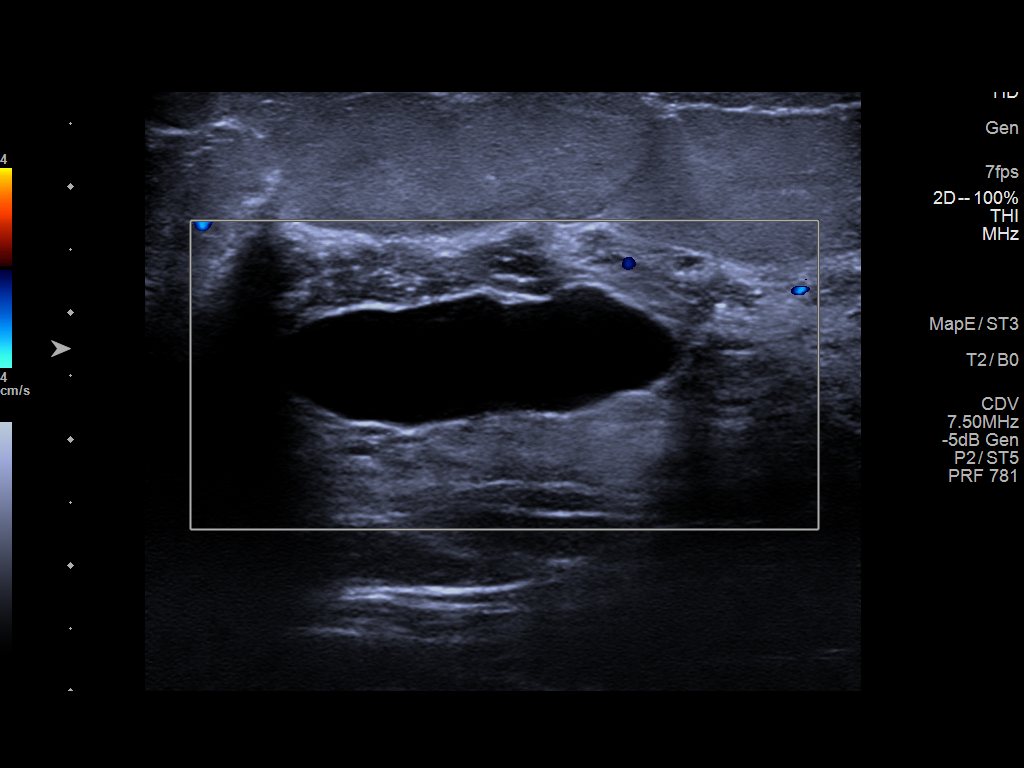

[5 of 5 positions shown; findings below may reference images not displayed]

ACR Breast Density Category d: The breast tissue is extremely dense,
which lowers the sensitivity of mammography.
FINDINGS: The possible mass noted in the upper outer right breast persists on
diagnostic spot compression imaging as oval, partly circumscribed
and partly obscured, approximately 3 cm, mass. Adjacent mostly
obscured masses are also suggested.

Targeted ultrasound is performed, showing an oval simple cyst in the
right breast at 10:30 o'clock, 4 cm the nipple, measuring 3.1 x
x 2.9 cm, consistent in size, shape and location to the mammographic
mass. Several other simple cysts are also noted in the upper outer
quadrant. There are no solid masses or suspicious lesions.
IMPRESSION: 1. No evidence of breast malignancy.
2. Benign right breast cysts.

RECOMMENDATION:
Screening mammogram in one year.(Code:D3-L-UUR)

I have discussed the findings and recommendations with the patient.
If applicable, a reminder letter will be sent to the patient
regarding the next appointment.

BI-RADS CATEGORY  2: Benign.

## 2023-02-27 IMAGING — MG MM DIGITAL DIAGNOSTIC UNILAT*R* W/ TOMO W/ CAD
4 series · 4 of 12 positions shown · non-contrast
Comparison: Previous exam(s).

CLINICAL DATA: Screening recall for a possible right breast mass.

EXAM:
DIGITAL DIAGNOSTIC UNILATERAL RIGHT MAMMOGRAM WITH TOMOSYNTHESIS AND
CAD; ULTRASOUND RIGHT BREAST LIMITED
TECHNIQUE: Right digital diagnostic mammography and breast tomosynthesis was
performed. The images were evaluated with computer-aided detection.;
Targeted ultrasound examination of the right breast was performed

[R CC synth-2D]
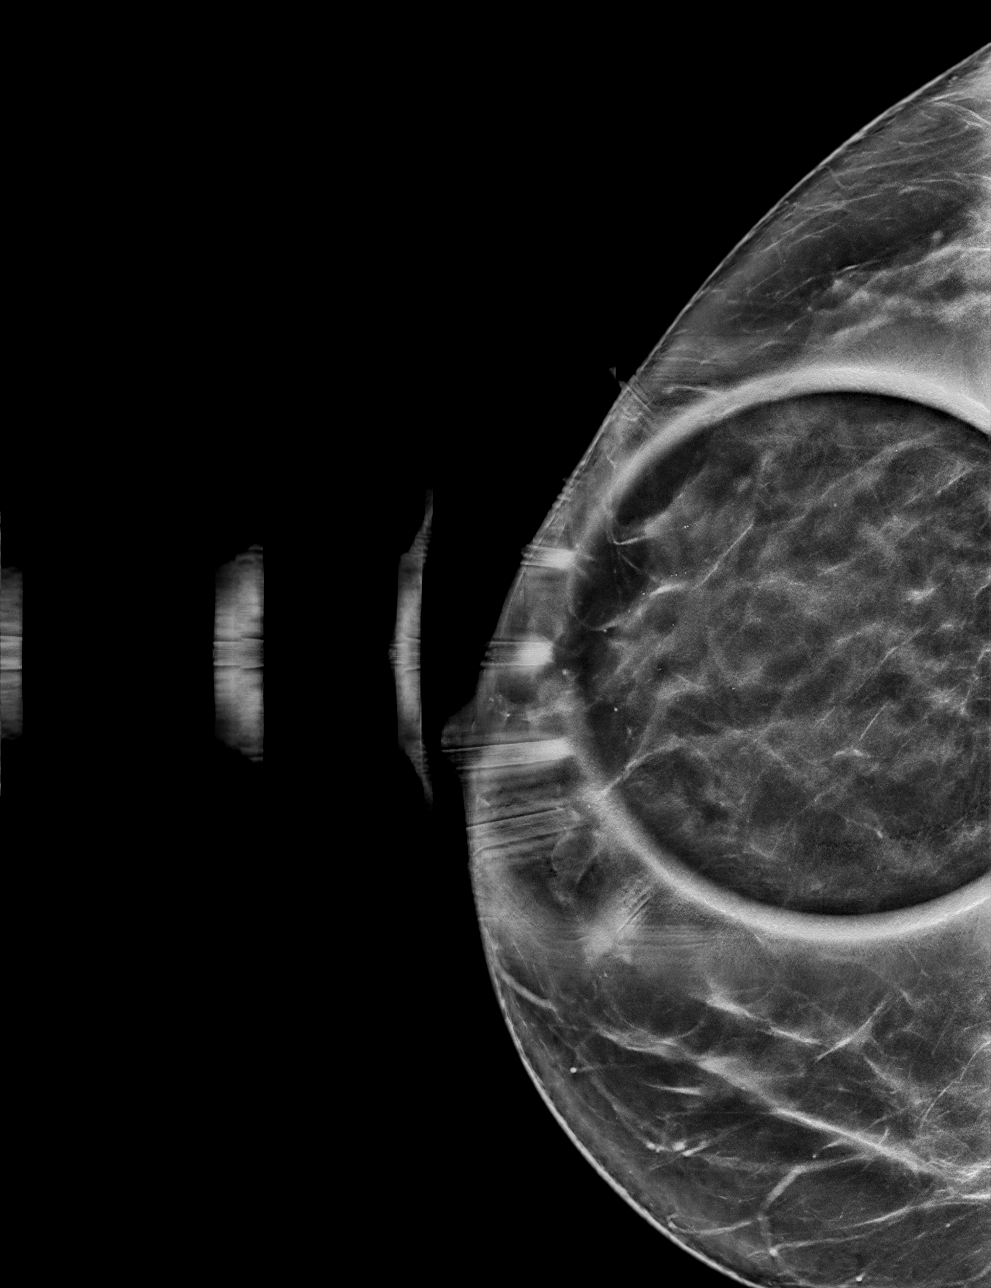

[R MLO synth-2D]
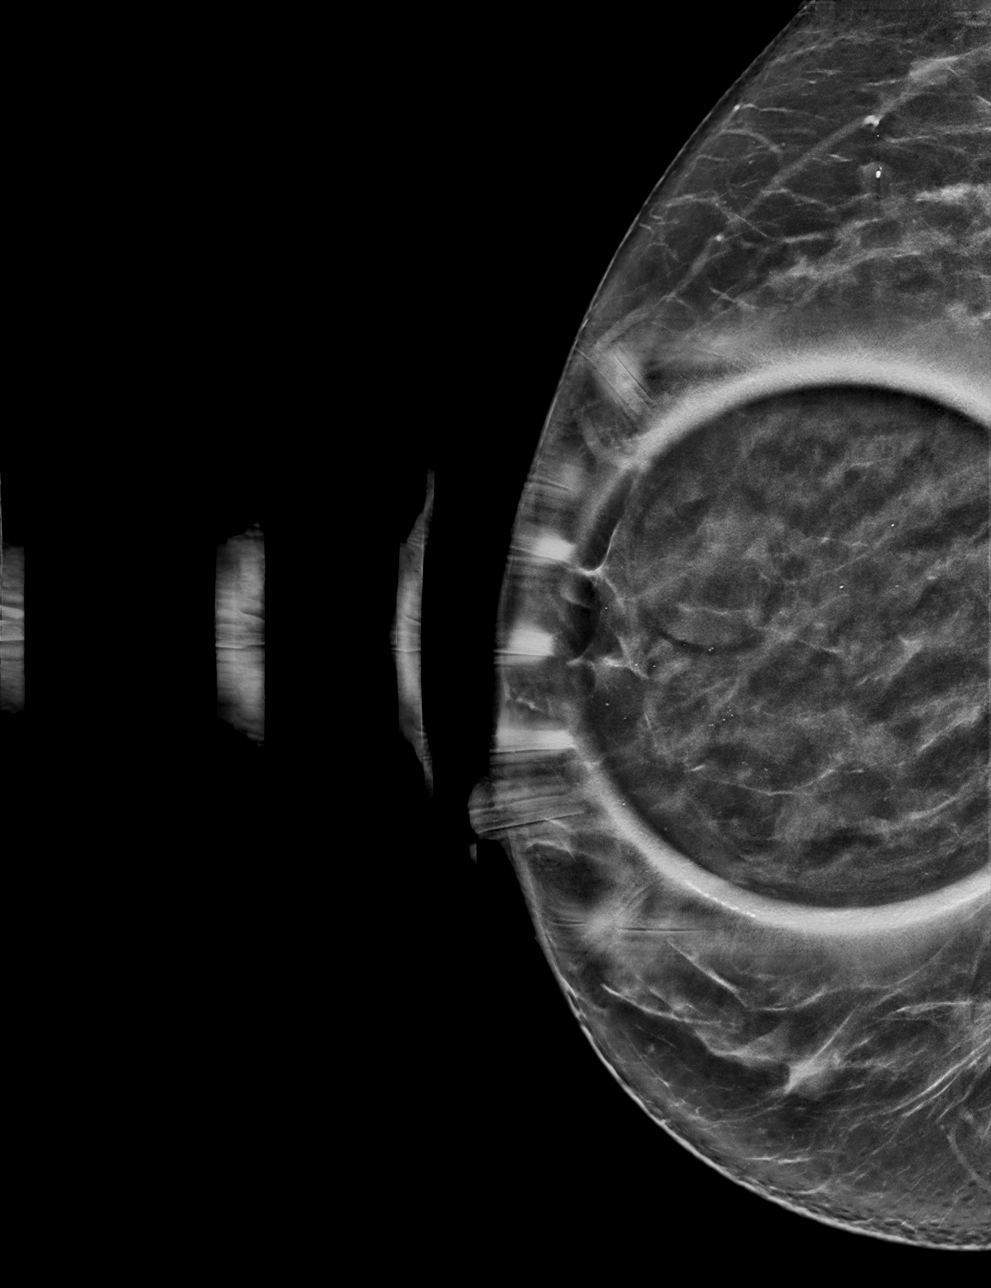

[R MLO tomo · tomo slice 41/81.0]
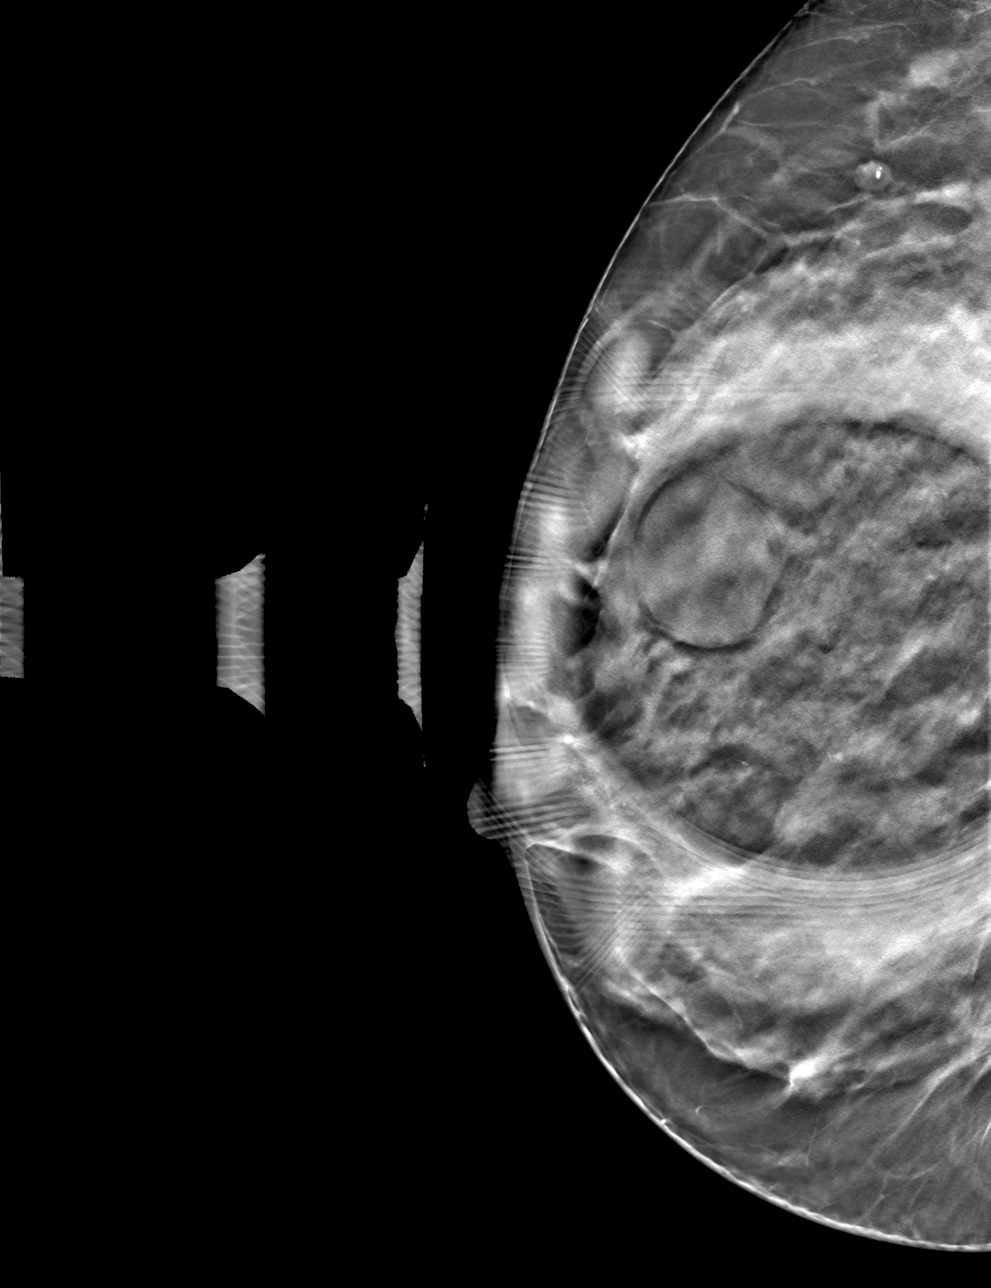

[R CC tomo · tomo slice 41/82.0]
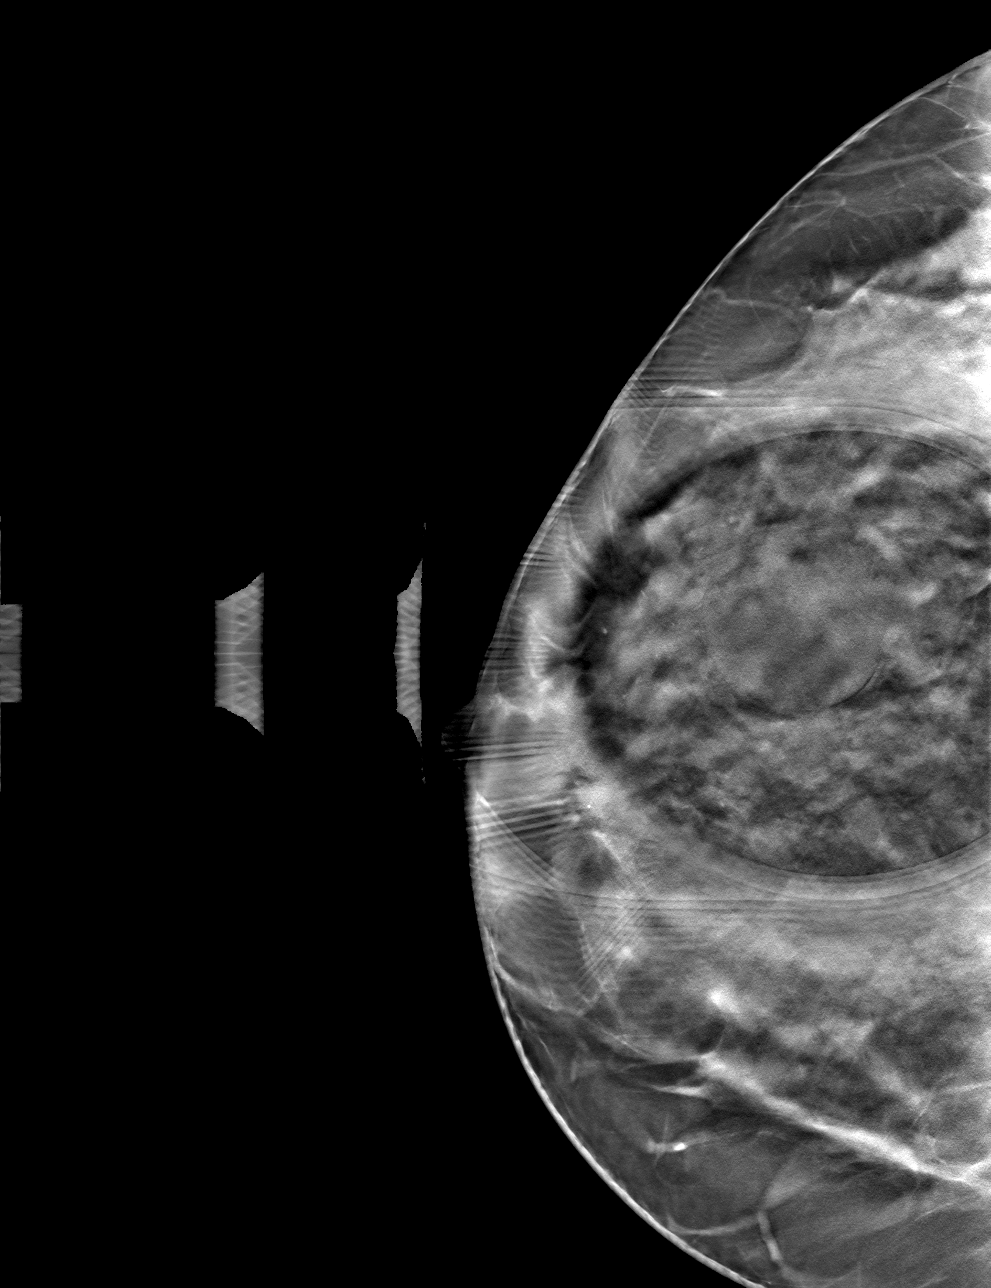

[4 of 12 positions shown; findings below may reference images not displayed]

ACR Breast Density Category d: The breast tissue is extremely dense,
which lowers the sensitivity of mammography.
FINDINGS: The possible mass noted in the upper outer right breast persists on
diagnostic spot compression imaging as oval, partly circumscribed
and partly obscured, approximately 3 cm, mass. Adjacent mostly
obscured masses are also suggested.

Targeted ultrasound is performed, showing an oval simple cyst in the
right breast at 10:30 o'clock, 4 cm the nipple, measuring 3.1 x
x 2.9 cm, consistent in size, shape and location to the mammographic
mass. Several other simple cysts are also noted in the upper outer
quadrant. There are no solid masses or suspicious lesions.
IMPRESSION: 1. No evidence of breast malignancy.
2. Benign right breast cysts.

RECOMMENDATION:
Screening mammogram in one year.(Code:D3-L-UUR)

I have discussed the findings and recommendations with the patient.
If applicable, a reminder letter will be sent to the patient
regarding the next appointment.

BI-RADS CATEGORY  2: Benign.

## 2023-03-01 ENCOUNTER — Encounter: Payer: Self-pay | Admitting: Nurse Practitioner

## 2023-03-02 ENCOUNTER — Encounter: Payer: Self-pay | Admitting: Internal Medicine

## 2023-03-02 ENCOUNTER — Ambulatory Visit: Payer: 59 | Admitting: Internal Medicine

## 2023-03-02 ENCOUNTER — Other Ambulatory Visit (HOSPITAL_COMMUNITY): Payer: Self-pay

## 2023-03-02 ENCOUNTER — Ambulatory Visit
Admission: RE | Admit: 2023-03-02 | Discharge: 2023-03-02 | Disposition: A | Discharge status: Home / Self Care | Payer: 59 | Attending: Internal Medicine | Admitting: Internal Medicine

## 2023-03-02 ENCOUNTER — Ambulatory Visit
Admission: RE | Admit: 2023-03-02 | Discharge: 2023-03-02 | Disposition: A | Discharge status: Home / Self Care | Payer: 59 | Source: Ambulatory Visit | Attending: Internal Medicine

## 2023-03-02 ENCOUNTER — Telehealth: Payer: Self-pay | Admitting: Internal Medicine

## 2023-03-02 VITALS — BP 118/74 | HR 78 | Temp 97.6°F | Ht 63.0 in | Wt 190.0 lb

## 2023-03-02 DIAGNOSIS — J209 Acute bronchitis, unspecified: Secondary | ICD-10-CM

## 2023-03-02 DIAGNOSIS — R053 Chronic cough: Secondary | ICD-10-CM | POA: Diagnosis not present

## 2023-03-02 DIAGNOSIS — Z950 Presence of cardiac pacemaker: Secondary | ICD-10-CM | POA: Diagnosis not present

## 2023-03-02 MED ORDER — FLUTICASONE-SALMETEROL 100-50 MCG/ACT IN AEPB
1.0000 | INHALATION_SPRAY | Freq: Two times a day (BID) | RESPIRATORY_TRACT | 3 refills | Status: DC
  Filled 2023-04-27: qty 60, 30d supply, fill #0
  Filled 2023-05-26: qty 60, 30d supply, fill #1

## 2023-03-02 NOTE — Progress Notes (Addendum)
 Acute Office Visit  Subjective:     Patient ID: Alyssa Melendez, female    DOB: 1970/11/29, 53 y.o.   MRN: [id number]  Chief Complaint  Patient presents with   Acute Visit    Chest congestion    HPI Patient is in today for persistent cough.  Patient states that she has had a persistent cough since Thanksgiving of last year.  She has been seen for this and was prescribed prednisone  initially which she had some trouble tolerating as well as Tessalon  Perles on December 29 as well as a course of Augmentin  on January 3.  Patient has completed her course of Augmentin  with no improvement in her symptoms.  She has persistent mostly nonproductive cough.  She states that when she does produce phlegm it is clear.  She denies any fevers or chills.  No shortness of breath but she does have some intermittent wheezing.  Review of Systems  Constitutional:  Negative for chills, fever and weight loss.  HENT: Negative.  Negative for congestion, sinus pain and sore throat.   Respiratory:  Positive for cough, sputum production (Scant clear sputum production) and wheezing. Negative for hemoptysis and shortness of breath.   Cardiovascular: Negative.         Objective:    BP 118/74   Pulse 78   Temp 97.6 F (36.4 C)   Ht 5' 3 (1.6 m)   Wt 190 lb (86.2 kg)   SpO2 98%   BMI 33.66 kg/m    Physical Exam Constitutional:      Appearance: Normal appearance.  HENT:     Head: Normocephalic and atraumatic.     Mouth/Throat:     Mouth: Mucous membranes are moist.     Pharynx: Oropharynx is clear. Posterior oropharyngeal erythema present. No oropharyngeal exudate.     Comments: Mild pharyngeal erythema noted Cardiovascular:     Rate and Rhythm: Normal rate and regular rhythm.     Heart sounds: Normal heart sounds.  Pulmonary:     Effort: Pulmonary effort is normal.     Breath sounds: Wheezing present. No rales.     Comments: Scattered bilateral intermittent wheezes noted more prominent in the  right lower lobe Neurological:     General: No focal deficit present.     Mental Status: She is alert.  Psychiatric:        Mood and Affect: Mood normal.        Behavior: Behavior normal.     No results found for any visits on 03/02/23.      Assessment & Plan:   Problem List Items Addressed This Visit       Respiratory   Acute bronchitis - Primary   -Patient presented today for follow-up of persistent cough which began last Thanksgiving.  She has finished a course of prednisone  and Augmentin  with no improvement in her symptoms. -She states that the cough is mainly nonproductive but occasionally produces clear phlegm -She does complain of intermittent wheezing.  No fevers or chills -She was prescribed Hycodan but has not tried this for her cough yet -I do suspect that her cough is likely secondary to a postinfectious bronchitis.  However, given that her symptoms have persisted for a while I have ordered a chest x-ray today for further workup -Given that she does have some intermittent wheezing noted on exam and was unable to tolerate prednisone  well I will prescribe Advair  to see if this will help alleviate some of her symptoms -Patient instructed to  follow-up with us  if her symptoms worsen or remain persistent despite treatment -If cough does remain persistent (more than 8 weeks) will consider workup for chronic cough GERD, asthma, postnasal drip etc.      Relevant Medications   fluticasone -salmeterol (ADVAIR ) 100-50 MCG/ACT AEPB   Other Relevant Orders   DG Chest 2 View    Meds ordered this encounter  Medications   fluticasone -salmeterol (ADVAIR ) 100-50 MCG/ACT AEPB    Sig: Inhale 1 puff into the lungs 2 (two) times daily.    Dispense:  1 each    Refill:  3    No follow-ups on file.  Zain Lankford, MD

## 2023-03-02 NOTE — Assessment & Plan Note (Addendum)
-  Patient presented today for follow-up of persistent cough which began last Thanksgiving.  She has finished a course of prednisone  and Augmentin  with no improvement in her symptoms. -She states that the cough is mainly nonproductive but occasionally produces clear phlegm -She does complain of intermittent wheezing.  No fevers or chills -She was prescribed Hycodan but has not tried this for her cough yet -I do suspect that her cough is likely secondary to a postinfectious bronchitis.  However, given that her symptoms have persisted for a while I have ordered a chest x-ray today for further workup -Given that she does have some intermittent wheezing noted on exam and was unable to tolerate prednisone  well I will prescribe Advair  to see if this will help alleviate some of her symptoms -Patient instructed to follow-up with us  if her symptoms worsen or remain persistent despite treatment -If cough does remain persistent (more than 8 weeks) will consider workup for chronic cough GERD, asthma, postnasal drip etc.

## 2023-03-02 NOTE — Patient Instructions (Signed)
-  It was a pleasure seeing you today -I do suspect that your persistent cough is secondary to a postinfectious bronchitis.  However, given that this cough has lasted over 4 weeks we will check an x-ray today -Please take your cough syrup at night to help you sleep and as needed -I have prescribed Advair  for you.  Please take 1 puff twice a day and rinse your mouth out afterwards -Please call us  with any questions or concerns or if your symptoms worsen or persist despite treatment

## 2023-03-02 NOTE — Telephone Encounter (Signed)
 I called the patient discussed the results of her chest x-ray with her.  Patient has no acute pathology noted on her x-ray.  I suspect that her symptoms are likely secondary to postinfectious bronchitis.  Her symptoms do not improve on current treatment regimen she will follow-up with us  for further evaluation.  Patient expressed understanding and is agreeable to plan.

## 2023-03-12 ENCOUNTER — Other Ambulatory Visit: Payer: Self-pay

## 2023-03-12 MED ORDER — AMOXICILLIN 500 MG PO CAPS
2000.0000 mg | ORAL_CAPSULE | Freq: Once | ORAL | 0 refills | Status: AC
  Filled 2023-03-12: qty 4, 1d supply, fill #0

## 2023-03-24 ENCOUNTER — Encounter: Payer: Self-pay | Admitting: Internal Medicine

## 2023-03-24 ENCOUNTER — Telehealth: Payer: Self-pay | Admitting: Internal Medicine

## 2023-03-24 ENCOUNTER — Other Ambulatory Visit: Payer: Self-pay

## 2023-03-24 ENCOUNTER — Ambulatory Visit (INDEPENDENT_AMBULATORY_CARE_PROVIDER_SITE_OTHER): Payer: 59 | Admitting: Internal Medicine

## 2023-03-24 VITALS — BP 100/82 | HR 80 | Ht 63.0 in | Wt 188.8 lb

## 2023-03-24 DIAGNOSIS — Z0000 Encounter for general adult medical examination without abnormal findings: Secondary | ICD-10-CM | POA: Diagnosis not present

## 2023-03-24 DIAGNOSIS — R0609 Other forms of dyspnea: Secondary | ICD-10-CM

## 2023-03-24 DIAGNOSIS — J22 Unspecified acute lower respiratory infection: Secondary | ICD-10-CM | POA: Diagnosis not present

## 2023-03-24 DIAGNOSIS — K219 Gastro-esophageal reflux disease without esophagitis: Secondary | ICD-10-CM | POA: Insufficient documentation

## 2023-03-24 DIAGNOSIS — Z0001 Encounter for general adult medical examination with abnormal findings: Secondary | ICD-10-CM

## 2023-03-24 DIAGNOSIS — J418 Mixed simple and mucopurulent chronic bronchitis: Secondary | ICD-10-CM

## 2023-03-24 DIAGNOSIS — E663 Overweight: Secondary | ICD-10-CM | POA: Diagnosis not present

## 2023-03-24 DIAGNOSIS — E785 Hyperlipidemia, unspecified: Secondary | ICD-10-CM | POA: Diagnosis not present

## 2023-03-24 DIAGNOSIS — R058 Other specified cough: Secondary | ICD-10-CM

## 2023-03-24 DIAGNOSIS — Z1211 Encounter for screening for malignant neoplasm of colon: Secondary | ICD-10-CM

## 2023-03-24 DIAGNOSIS — Z1231 Encounter for screening mammogram for malignant neoplasm of breast: Secondary | ICD-10-CM

## 2023-03-24 MED ORDER — FLUCONAZOLE 100 MG PO TABS
100.0000 mg | ORAL_TABLET | Freq: Every day | ORAL | 0 refills | Status: DC
  Filled 2023-03-24: qty 1, 1d supply, fill #0

## 2023-03-24 MED ORDER — OMEPRAZOLE 20 MG PO CPDR
20.0000 mg | DELAYED_RELEASE_CAPSULE | Freq: Every day | ORAL | 0 refills | Status: DC
  Filled 2023-03-24: qty 60, 60d supply, fill #0

## 2023-03-24 MED ORDER — FLUCONAZOLE 100 MG PO TABS
100.0000 mg | ORAL_TABLET | Freq: Every day | ORAL | 0 refills | Status: AC
Start: 2023-03-24 — End: 2023-03-29
  Filled 2023-03-24: qty 2, 2d supply, fill #0

## 2023-03-24 NOTE — Assessment & Plan Note (Signed)
Advised that the nocturnal cough may be aggravated by GERD.and advised to sleep on a  wedge

## 2023-03-24 NOTE — Telephone Encounter (Signed)
 Lft pt vm to call ofc to sch CT. thanks

## 2023-03-24 NOTE — Assessment & Plan Note (Signed)

## 2023-03-24 NOTE — Assessment & Plan Note (Signed)
Referred to pulmonology in 2023:  encouraged to increase aerobic exercise to improve conditioning

## 2023-03-24 NOTE — Patient Instructions (Signed)
1) Sleep on a WEDGE  2) TESTING FOR RSV TODAY ,  AND CHEST CT TO FOLLOW IF NEGATIVE

## 2023-03-24 NOTE — Progress Notes (Addendum)
 Patient ID: Alyssa Melendez, female    DOB: 29-Jul-1970  Age: 53 y.o. MRN: [id number]  The patient is here for annual preventive examination and management of other chronic and acute problems.   The risk factors are reflected in the social history.   The roster of all physicians providing medical care to patient - is listed in the Snapshot section of the chart.   Activities of daily living:  The patient is 100% independent in all ADLs: dressing, toileting, feeding as well as independent mobility   Home safety : The patient has smoke detectors in the home. They wear seatbelts.  There are no unsecured firearms at home. There is no violence in the home.    There is no risks for hepatitis, STDs or HIV. There is no   history of blood transfusion. They have no travel history to infectious disease endemic areas of the world.   The patient has seen their dentist in the last six month. They have seen their eye doctor in the last year. The patinet  denies slight hearing difficulty with regard to whispered voices and some television programs.  They have deferred audiologic testing in the last year.  They do not  have excessive sun exposure. Discussed the need for sun protection: hats, long sleeves and use of sunscreen if there is significant sun exposure.    Diet: the importance of a healthy diet is discussed. They do have a healthy diet.   The benefits of regular aerobic exercise were discussed. The patient  exercises  3 to 5 days per week  for  60 minutes.    Depression screen: there are no signs or vegative symptoms of depression- irritability, change in appetite, anhedonia, sadness/tearfullness.   The following portions of the patient's history were reviewed and updated as appropriate: allergies, current medications, past family history, past medical history,  past surgical history, past social history  and problem list.   Visual acuity was not assessed per patient preference since the patient has  regular follow up with an  ophthalmologist. Hearing and body mass index were assessed and reviewed.    During the course of the visit the patient was educated and counseled about appropriate screening and preventive services including : fall prevention , diabetes screening, nutrition counseling, colorectal cancer screening, and recommended immunizations.    Chief Complaint:   1) recurrent cough since November. Treated in November for viral URI with pred taper,  no change,  saw dr Winton Haws and treated wth abx , Wixela,  and cough suppressant with hydrocodone  , felt better for one week,  then symptoms returned,  no sneezing,  no rhinorrhea,  cough starts in the afternoon and through the night.  No fevers. Chest ray normal recently  jan 13. .  Sinuses and ears feel full but are normal appearing.  Started using azelastine last week  twice daily,  still using wixela . 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    Physical Exam:  BP 100/82   Pulse 80   Ht 5\' 3"  (1.6 m)   Wt 188 lb  12.8 oz (85.6 kg)   SpO2 97%   BMI 33.44 kg/m    Physical Exam Vitals reviewed.  Constitutional:      General: She is not in acute distress.    Appearance: Normal appearance. She is normal weight. She is not ill-appearing, toxic-appearing or diaphoretic.  HENT:     Head: Normocephalic.  Eyes:     General: No scleral icterus.       Right eye: No discharge.        Left eye: No discharge.     Conjunctiva/sclera:  Conjunctivae normal.  Cardiovascular:     Rate and Rhythm: Normal rate and regular rhythm.     Heart sounds: Normal heart sounds.  Pulmonary:     Effort: Pulmonary effort is normal. No respiratory distress.     Breath sounds: Normal breath sounds.  Musculoskeletal:        General: Normal range of motion.  Skin:    General: Skin is warm and dry.  Neurological:     General: No focal deficit present.     Mental Status: She is alert and oriented to person, place, and time. Mental status is at baseline.  Psychiatric:        Mood and Affect: Mood normal.        Behavior: Behavior normal.        Thought Content: Thought content normal.        Judgment: Judgment normal.    Assessment and Plan: Encounter for general adult medical examination with abnormal findings Assessment & Plan: age appropriate education and counseling updated, referrals for preventative services and immunizations addressed, dietary and smoking counseling addressed, most recent labs reviewed.  I have personally reviewed and have noted:   1) the patient's medical and social history 2) The pt's use of alcohol, tobacco, and illicit drugs 3) The patient's current medications and supplements 4) Functional ability including ADL's, fall risk, home safety risk, hearing and visual impairment 5) Diet and physical activities 6) Evidence for depression or mood disorder 7) The patient's height, weight, and BMI have been recorded in the chart   I have made referrals, and provided counseling and education based on review of the above    Hyperlipidemia with target LDL less than 100 -     Lipid panel -     LDL cholesterol, direct  Overweight -     Comprehensive metabolic panel with GFR -     Hemoglobin A1c -     CBC with Differential/Platelet -     TSH  Encounter for preventative adult health care examination  Colon cancer screening -     Cologuard  Encounter for screening mammogram for malignant neoplasm of breast -      3D Screening Mammogram, Left and Right; Future  Lower respiratory infection -     CT CHEST W CONTRAST; Future -     Respiratory virus panel  Cough present for greater than 3 weeks -     CT CHEST W CONTRAST; Future -     Respiratory virus panel  Mixed simple and mucopurulent chronic bronchitis (HCC) Assessment & Plan: Checking for RSV.  If negative,  CT chest needed    Exertional dyspnea Assessment & Plan: Referred to pulmonology in 2023:  encouraged to increase aerobic exercise to improve conditioning    Gastroesophageal reflux disease without esophagitis Assessment & Plan: Advised that the nocturnal cough may be aggravated by GERD.and advised to sleep on a  wedge    Other orders -  Fluconazole ; Take 1 tablet (100 mg total) by mouth daily for 2 days.  Dispense: 2 tablet; Refill: 0    No follow-ups on file.  Thersia Flax, MD

## 2023-03-24 NOTE — Assessment & Plan Note (Signed)
Checking for RSV.  If negative,  CT chest needed

## 2023-03-25 ENCOUNTER — Telehealth: Payer: Self-pay | Admitting: Internal Medicine

## 2023-03-25 ENCOUNTER — Encounter: Payer: Self-pay | Admitting: Internal Medicine

## 2023-03-25 LAB — COMPREHENSIVE METABOLIC PANEL
ALT: 17 U/L (ref 0–35)
AST: 19 U/L (ref 0–37)
Albumin: 4.2 g/dL (ref 3.5–5.2)
Alkaline Phosphatase: 62 U/L (ref 39–117)
BUN: 14 mg/dL (ref 6–23)
CO2: 28 meq/L (ref 19–32)
Calcium: 8.8 mg/dL (ref 8.4–10.5)
Chloride: 105 meq/L (ref 96–112)
Creatinine, Ser: 1 mg/dL (ref 0.40–1.20)
GFR: 65.64 mL/min (ref >60.00)
Glucose, Bld: 83 mg/dL (ref 70–99)
Potassium: 3.9 meq/L (ref 3.5–5.1)
Sodium: 139 meq/L (ref 135–145)
Total Bilirubin: 0.4 mg/dL (ref 0.2–1.2)
Total Protein: 6.6 g/dL (ref 6.0–8.3)

## 2023-03-25 LAB — CBC WITH DIFFERENTIAL/PLATELET
Basophils Absolute: 0.1 10*3/uL (ref 0.0–0.1)
Basophils Relative: 0.8 % (ref 0.0–3.0)
Eosinophils Absolute: 0.2 10*3/uL (ref 0.0–0.7)
Eosinophils Relative: 2.3 % (ref 0.0–5.0)
HCT: 42.5 % (ref 36.0–46.0)
Hemoglobin: 14.1 g/dL (ref 12.0–15.0)
Lymphocytes Relative: 12.3 % (ref 12.0–46.0)
Lymphs Abs: 0.9 10*3/uL (ref 0.7–4.0)
MCHC: 33.3 g/dL (ref 30.0–36.0)
MCV: 93.5 fL (ref 78.0–100.0)
Monocytes Absolute: 0.6 10*3/uL (ref 0.1–1.0)
Monocytes Relative: 9.2 % (ref 3.0–12.0)
Neutro Abs: 5.3 10*3/uL (ref 1.4–7.7)
Neutrophils Relative %: 75.4 % (ref 43.0–77.0)
Platelets: 284 10*3/uL (ref 150.0–400.0)
RBC: 4.55 Mil/uL (ref 3.87–5.11)
RDW: 13.5 % (ref 11.5–15.5)
WBC: 7 10*3/uL (ref 4.0–10.5)

## 2023-03-25 LAB — TSH: TSH: 1.14 u[IU]/mL (ref 0.35–5.50)

## 2023-03-25 LAB — LIPID PANEL
Cholesterol: 159 mg/dL (ref 0–200)
HDL: 38.3 mg/dL — ABNORMAL LOW (ref >39.00)
LDL Cholesterol: 93 mg/dL (ref 0–99)
NonHDL: 120.92
Total CHOL/HDL Ratio: 4
Triglycerides: 138 mg/dL (ref 0.0–149.0)
VLDL: 27.6 mg/dL (ref 0.0–40.0)

## 2023-03-25 LAB — LDL CHOLESTEROL, DIRECT: Direct LDL: 113 mg/dL

## 2023-03-25 LAB — HEMOGLOBIN A1C: Hgb A1c MFr Bld: 6 % (ref 4.6–6.5)

## 2023-03-25 NOTE — Telephone Encounter (Signed)
 Lft pt vm to call ofc to sch CT. thanks

## 2023-03-27 ENCOUNTER — Other Ambulatory Visit: Payer: Self-pay | Admitting: Internal Medicine

## 2023-03-27 ENCOUNTER — Other Ambulatory Visit: Payer: Self-pay

## 2023-03-27 DIAGNOSIS — J411 Mucopurulent chronic bronchitis: Secondary | ICD-10-CM

## 2023-03-27 LAB — RESPIRATORY VIRUS PANEL

## 2023-03-27 MED ORDER — AMOXICILLIN-POT CLAVULANATE 875-125 MG PO TABS
1.0000 | ORAL_TABLET | Freq: Two times a day (BID) | ORAL | 0 refills | Status: DC
  Filled 2023-03-27: qty 14, 7d supply, fill #0

## 2023-03-27 NOTE — Assessment & Plan Note (Signed)
 Respiratory virus panel negative,  since cough improved with 2000 mg amoxicillin  (pre procedure phrophyaxis) will treat for 1 week with augmentin .  If cough returns,  will reeval with chest CT

## 2023-04-01 ENCOUNTER — Telehealth: Payer: Self-pay | Admitting: Internal Medicine

## 2023-04-01 NOTE — Telephone Encounter (Signed)
Lft pt vm to call ofc regarding ins. thanks

## 2023-04-02 ENCOUNTER — Telehealth: Payer: Self-pay | Admitting: Internal Medicine

## 2023-04-02 NOTE — Telephone Encounter (Signed)
Lft pt vm to call ofc regarding ins. thanks

## 2023-04-03 ENCOUNTER — Ambulatory Visit: Payer: 59

## 2023-04-08 ENCOUNTER — Ambulatory Visit
Admission: RE | Admit: 2023-04-08 | Discharge: 2023-04-08 | Disposition: A | Discharge status: Home / Self Care | Payer: 59 | Source: Ambulatory Visit | Attending: Internal Medicine | Admitting: Internal Medicine

## 2023-04-08 DIAGNOSIS — R058 Other specified cough: Secondary | ICD-10-CM | POA: Insufficient documentation

## 2023-04-08 DIAGNOSIS — J22 Unspecified acute lower respiratory infection: Secondary | ICD-10-CM | POA: Diagnosis not present

## 2023-04-08 DIAGNOSIS — R053 Chronic cough: Secondary | ICD-10-CM | POA: Diagnosis not present

## 2023-04-08 DIAGNOSIS — Z1211 Encounter for screening for malignant neoplasm of colon: Secondary | ICD-10-CM | POA: Diagnosis not present

## 2023-04-08 DIAGNOSIS — K449 Diaphragmatic hernia without obstruction or gangrene: Secondary | ICD-10-CM | POA: Diagnosis not present

## 2023-04-08 MED ORDER — IOHEXOL 300 MG/ML  SOLN
75.0000 mL | Freq: Once | INTRAMUSCULAR | Status: AC | PRN
  Administered 2023-04-08: 75 mL via INTRAVENOUS

## 2023-04-13 LAB — COLOGUARD: COLOGUARD: NEGATIVE

## 2023-04-16 ENCOUNTER — Other Ambulatory Visit: Payer: Self-pay

## 2023-04-16 ENCOUNTER — Other Ambulatory Visit (HOSPITAL_COMMUNITY): Payer: Self-pay

## 2023-04-16 ENCOUNTER — Other Ambulatory Visit: Payer: Self-pay | Admitting: Internal Medicine

## 2023-04-16 MED ORDER — OMEPRAZOLE 20 MG PO CPDR
20.0000 mg | DELAYED_RELEASE_CAPSULE | Freq: Every day | ORAL | 1 refills | Status: DC
  Filled 2023-04-16 – 2023-05-26 (×2): qty 90, 90d supply, fill #0

## 2023-04-17 ENCOUNTER — Other Ambulatory Visit (HOSPITAL_BASED_OUTPATIENT_CLINIC_OR_DEPARTMENT_OTHER): Payer: Self-pay

## 2023-04-17 ENCOUNTER — Other Ambulatory Visit: Payer: Self-pay

## 2023-04-24 ENCOUNTER — Telehealth: Payer: Self-pay

## 2023-04-24 NOTE — Telephone Encounter (Signed)
 Lvm informing pt we reach out to radiology and report would be ready soon. Okay to relay message

## 2023-04-24 NOTE — Telephone Encounter (Signed)
 Copied from CRM [id number]. Topic: Clinical - Lab/Test Results >> Apr 24, 2023  1:56 PM Alyssa Melendez wrote: Reason for CRM:   Patient is calling regarding a CT chest scan she had performed on 02/19. The scan was performed at [facility]. She believes the results would be sent to [facility]; however, she has still not received results and requests call back.   CB# [phone], if she does not answer please leave detailed message.

## 2023-04-26 ENCOUNTER — Other Ambulatory Visit: Payer: Self-pay | Admitting: Internal Medicine

## 2023-04-26 ENCOUNTER — Encounter: Payer: Self-pay | Admitting: Internal Medicine

## 2023-04-27 ENCOUNTER — Other Ambulatory Visit: Payer: Self-pay

## 2023-04-28 ENCOUNTER — Other Ambulatory Visit: Payer: Self-pay | Admitting: Internal Medicine

## 2023-04-28 ENCOUNTER — Other Ambulatory Visit: Payer: Self-pay

## 2023-04-28 DIAGNOSIS — J411 Mucopurulent chronic bronchitis: Secondary | ICD-10-CM

## 2023-04-29 ENCOUNTER — Telehealth: Payer: Self-pay | Admitting: Internal Medicine

## 2023-04-29 DIAGNOSIS — I469 Cardiac arrest, cause unspecified: Secondary | ICD-10-CM | POA: Diagnosis not present

## 2023-04-29 DIAGNOSIS — Z95810 Presence of automatic (implantable) cardiac defibrillator: Secondary | ICD-10-CM | POA: Diagnosis not present

## 2023-04-29 DIAGNOSIS — Z9889 Other specified postprocedural states: Secondary | ICD-10-CM | POA: Diagnosis not present

## 2023-04-29 DIAGNOSIS — I4581 Long QT syndrome: Secondary | ICD-10-CM | POA: Diagnosis not present

## 2023-04-29 NOTE — Telephone Encounter (Signed)
 Appt confirmed on 04/30/23

## 2023-04-30 ENCOUNTER — Ambulatory Visit: Payer: 59 | Attending: Internal Medicine | Admitting: Internal Medicine

## 2023-04-30 ENCOUNTER — Encounter: Payer: Self-pay | Admitting: Internal Medicine

## 2023-04-30 VITALS — BP 122/87 | HR 63 | Wt 189.6 lb

## 2023-04-30 DIAGNOSIS — Q332 Sequestration of lung: Secondary | ICD-10-CM | POA: Diagnosis not present

## 2023-04-30 DIAGNOSIS — Z8674 Personal history of sudden cardiac arrest: Secondary | ICD-10-CM | POA: Diagnosis not present

## 2023-04-30 DIAGNOSIS — E782 Mixed hyperlipidemia: Secondary | ICD-10-CM | POA: Diagnosis not present

## 2023-04-30 DIAGNOSIS — I341 Nonrheumatic mitral (valve) prolapse: Secondary | ICD-10-CM

## 2023-04-30 NOTE — Patient Instructions (Signed)
  Follow-Up in: Please follow up with the [facility] in 6 months. Our Doctors' schedules are NOT open yet for 8 months. We will place you on our recall list. Once they are available, we will call you to schedule your follow up appointment.   At the [facility], you and your health needs are our priority. We have a designated team specialized in the treatment of Heart Failure. This Care Team includes your primary Heart Failure Specialized Cardiologist (physician), Advanced Practice Providers (APPs- Physician Assistants and Nurse Practitioners), and Pharmacist who all work together to provide you with the care you need, when you need it.   You may see any of the following providers on your designated Care Team at your next follow up:  Dr. Arvilla Meres Dr. Marca Ancona Dr. Dorthula Nettles Dr. Theresia Bough Clarisa Kindred, FNP Enos Fling, RPH-CPP  Please be sure to bring in all your medications bottles to every appointment.   Need to Contact us:  If you have any questions or concerns before your next appointment please send [place] a message through [place] or call our office at [phone].    TO LEAVE A MESSAGE FOR THE NURSE SELECT OPTION 2, PLEASE LEAVE A MESSAGE INCLUDING: YOUR NAME DATE OF BIRTH CALL BACK NUMBER REASON FOR CALL**this is important as we prioritize the call backs  YOU WILL RECEIVE A CALL BACK THE SAME DAY AS LONG AS YOU CALL BEFORE 4:00 PM

## 2023-04-30 NOTE — Progress Notes (Signed)
 [facility] CONSULT NOTE  Referring Physician: Sherlene Shams, MD Primary Care: Sherlene Shams, MD Primary Cardiologist: Dr. 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 Cath in 2013 with normal coronaries.   Had on shock for VT when giving a talk at church   Etiology of VF never ascertained (? Long QT - QT on presentation with VF but since normalized)  Genetic testing in 2009 (Dr. Jaquita Folds at [facility]) - negative testing Repeat Genetic testing with Lars Masson ([facility]) in 2016 - negative. Saw Dr. Wynelle Link at 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 Charolotte Eke (EP at [facility]) in 2022  Echo 12/24 EF 55-60% RV low normal s/p MV repair mild to moderate MR (I think mild) Personally reviewed  Here for f/u. Overall doing ok. Got a viral URI in 11/24 and has had persistent cough. Has had 2 rounds abx and 1 round of steroids Chest CT 04/08/23 with mucoid impaction in LLL. Finally getting better. Has f/u with Pulmonary. Saw EP yesterday and no events on device.    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 PRN BEFORE DENTAL PROCEDURES     aspirin 81 MG tablet Take 81 mg by mouth daily.     atenolol (TENORMIN) 25 MG tablet Take 1 tablet (25 mg  total) by mouth daily. 90 tablet 3   Cholecalciferol 2000 UNITS CAPS Take 1 capsule by mouth daily.     Coenzyme Q10 (COQ10) 100 MG CAPS Take 100 mg by mouth daily. 30 capsule 5   famotidine (PEPCID) 20 MG tablet Take 20 mg by mouth 2 (two) times daily.     fluticasone-salmeterol (ADVAIR) 100-50 MCG/ACT AEPB Inhale 1 puff into the lungs 2 (two) times daily. 60 each 3   levonorgestrel (MIRENA) 20 MCG/DAY IUD 1 each by Intrauterine route once. INSERTED 05/06/21     omeprazole (PRILOSEC) 20 MG capsule Take 1 capsule (20 mg total) by mouth daily. 90 capsule 1   rosuvastatin (CRESTOR) 5 MG tablet Take 1 tablet (5 mg total) by mouth daily. 90 tablet 3   No current facility-administered medications for this visit.    Allergies  Allergen Reactions   Morphine And Codeine     GI - vomitting      Social History   Socioeconomic History   Marital status: Married    Spouse name: Not on file   Number of children: Not on file   Years of education: Not on file   Highest education level: Not on file  Occupational History   Not on  file  Tobacco Use   Smoking status: Never   Smokeless tobacco: Never  Vaping Use   Vaping status: Never Used  Substance and Sexual Activity   Alcohol use: Yes    Comment: occassionally   Drug use: No   Sexual activity: Yes    Birth control/protection: None    Comment: vasectomy  Other Topics Concern   Not on file  Social History Narrative   ** Merged History Encounter **       Social Drivers of Health   Financial Resource Strain: Not on file  Food Insecurity: Not on file  Transportation Needs: Not on file  Physical Activity: Sufficiently Active (08/27/2017)   Exercise Vital Sign    Days of Exercise per Week: 5 days    Minutes of Exercise per Session: 30 min  Stress: Not on file  Social Connections: Not on file  Intimate Partner Violence: Not on file      Family History  Problem Relation Age of Onset   Hyperlipidemia Mother    Cancer Mother 68        melanoma   Arthritis Mother        bilateral total knee replacements   Heart disease Father        CABG x 3    Mental illness Father        Alzheimers Dementia   Hyperlipidemia Father    Stroke Father        ich   Breast cancer Neg Hx    Ovarian cancer Neg Hx    Colon cancer Neg Hx     Vitals:   04/30/23 1340  BP: 122/87  Pulse: 63  SpO2: 100%  Weight: 189 lb 9.6 oz (86 kg)   Wt Readings from Last 3 Encounters:  04/30/23 189 lb 9.6 oz (86 kg)  03/24/23 188 lb 12.8 oz (85.6 kg)  03/02/23 190 lb (86.2 kg)     PHYSICAL EXAM: General:  Well appearing. No resp difficulty HEENT: normal Neck: supple. no JVD. Carotids 2+ bilat; no bruits. No lymphadenopathy or thryomegaly appreciated. Cor: PMI nondisplaced. Regular rate & rhythm. No rubs, gallops or murmurs. Lungs: clear Abdomen: soft, nontender, nondistended. No hepatosplenomegaly. No bruits or masses. Good bowel sounds. Extremities: no cyanosis, clubbing, rash, edema Neuro: alert & orientedx3, cranial nerves grossly intact. moves all 4 extremities w/o difficulty. Affect pleasant   ECG: Normal sinus rhythm with periods of a-pacing 74. Non-specific STs. QTc 399 ms Personally reviewed   ASSESSMENT & PLAN:  1. H/o VF arrest 2009 - Etiology unclear ? Long QT vs malignant MV syndrome vs catecholamine induced PVT - genetic testing negative x 2 (2009, 2016) @ [facility] - I agree with Dr. 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 No events on device yesterday with EP  - Avoid competitive sports  2. 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 Obesity - discussed potential GLP1RA - I had her meet with PharmD previouslyand insurance  will not cover - Body mass index is 33.59 kg/m.  4. Screening for CAD - previous cath no CAD in 2013 - Consider CAC at some point. - no s/s angina  5. Hyperlipdemia -  Continue crestor 10  - LDL 161 -> 93 - HDL low   6. Pulmonary sequestration syndrome - CT reviewed with her personally - I d/w Pulmonary - Will need to see TCTS for possible resection.   Arvilla Meres, MD  1:46 PM

## 2023-05-12 ENCOUNTER — Encounter: Payer: Self-pay | Admitting: Thoracic Surgery (Cardiothoracic Vascular Surgery)

## 2023-05-12 ENCOUNTER — Institutional Professional Consult (permissible substitution) (INDEPENDENT_AMBULATORY_CARE_PROVIDER_SITE_OTHER): Admitting: Thoracic Surgery (Cardiothoracic Vascular Surgery)

## 2023-05-12 VITALS — BP 129/85 | HR 75 | Resp 20 | Ht 63.0 in | Wt 190.0 lb

## 2023-05-12 DIAGNOSIS — Q332 Sequestration of lung: Secondary | ICD-10-CM | POA: Insufficient documentation

## 2023-05-12 NOTE — Progress Notes (Signed)
 PCP is Sherlene Shams, MD Referring Provider is Bensimhon, Bevelyn Buckles, MD  Chief Complaint  Patient presents with   Pulmonary Sequestration Syndrome    New patient consult, Chest CT 2/19, ECHO 2/13    HPI: Alyssa Melendez is sent for consultation regarding a pulmonary sequestration.  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  Alyssa Melendez was in her usual state of health until about 3 months ago.  Alyssa Melendez came down with what Alyssa Melendez thought was a virus.  Alyssa Melendez was treated with steroids and had 2 rounds of antibiotics but continues to have a nonproductive cough.  Alyssa Melendez has persistent bouts of coughing.  Also was started on Advair.  Nothing has significantly affected the cough.   Alyssa Melendez had a chest x-ray in January which showed no active disease.  Because the cough persisted Alyssa Melendez had a CT in February which showed an area of air trapping and mucoid impaction and a dilated airway in the left lower lobe consistent with a sequestration.  Alyssa Melendez is not having any fevers, chills, or sweats.  Alyssa Melendez is able to go about her daily activities without limitation.  Zubrod Score: At the time of surgery this patient's most appropriate activity status/level should be described as: [x]     0    Normal activity, no symptoms []     1    Restricted in physical strenuous activity but ambulatory, able to do out light work []     2    Ambulatory and capable of self care, unable to do work activities, up and about >50 % of waking hours                              []     3    Only limited self care, in bed greater than 50% of waking hours []     4    Completely disabled, no self care, confined to bed or chair []     5    Moribund  Past Medical History:  Diagnosis Date   Barlow syndrome    Cardiac arrest (HCC) 11/22/2012   Complex ovarian  cyst    Congestive heart failure (HCC)    Fibroadenoma    Hx-sudden cardiac arrest 2009   secondary to Long QT Syndrome   Increased BMI    LLQ pain    Long Q-T syndrome 2009   with Sudden Cardiac arrest   Mitral valve prolapse    OAB (overactive bladder)     Past Surgical History:  Procedure Laterality Date   BREAST BIOPSY Left    Negative 10+years ago   BREAST EXCISIONAL BIOPSY Left    fibroadenoma over 10 years ago   BREAST SURGERY  2001   Benign   CARDIAC DEFIBRILLATOR PLACEMENT  2009   CARDIAC DEFIBRILLATOR PLACEMENT  June 2013   CESAREAN SECTION  2007 and 2005   MITRAL VALVE REPAIR      Family History  Problem Relation Age of Onset   Hyperlipidemia Mother    Cancer Mother 32       melanoma   Arthritis Mother        bilateral total knee replacements   Heart disease Father        CABG x 3    Mental illness Father  Alzheimers Dementia   Hyperlipidemia Father    Stroke Father        ich   Breast cancer Neg Hx    Ovarian cancer Neg Hx    Colon cancer Neg Hx     Social History Social History   Tobacco Use   Smoking status: Never   Smokeless tobacco: Never  Vaping Use   Vaping status: Never Used  Substance Use Topics   Alcohol use: Yes    Comment: occassionally   Drug use: No    Current Outpatient Medications  Medication Sig Dispense Refill   amoxicillin (AMOXIL) 500 MG capsule Take 2,000 mg by mouth. PRN BEFORE DENTAL PROCEDURES     aspirin 81 MG tablet Take 81 mg by mouth daily.     atenolol (TENORMIN) 25 MG tablet Take 1 tablet (25 mg total) by mouth daily. 90 tablet 3   Cholecalciferol 2000 UNITS CAPS Take 1 capsule by mouth daily.     Coenzyme Q10 (COQ10) 100 MG CAPS Take 100 mg by mouth daily. 30 capsule 5   famotidine (PEPCID) 20 MG tablet Take 20 mg by mouth 2 (two) times daily.     fluticasone-salmeterol (ADVAIR) 100-50 MCG/ACT AEPB Inhale 1 puff into the lungs 2 (two) times daily. 60 each 3   levonorgestrel (MIRENA) 20 MCG/DAY IUD 1  each by Intrauterine route once. INSERTED 05/06/21     omeprazole (PRILOSEC) 20 MG capsule Take 1 capsule (20 mg total) by mouth daily. 90 capsule 1   rosuvastatin (CRESTOR) 5 MG tablet Take 1 tablet (5 mg total) by mouth daily. 90 tablet 3   No current facility-administered medications for this visit.    Allergies  Allergen Reactions   Morphine And Codeine     GI - vomitting    Review of Systems  Constitutional:  Negative for activity change and unexpected weight change.  HENT:  Negative for trouble swallowing and voice change.   Respiratory:  Positive for cough. Negative for shortness of breath.   Cardiovascular:  Negative for chest pain and leg swelling.  Gastrointestinal:  Negative for abdominal distention and abdominal pain.  Genitourinary:  Negative for difficulty urinating and dysuria.  Musculoskeletal: Negative.   Neurological:  Negative for seizures and weakness.  Hematological:  Negative for adenopathy. Does not bruise/bleed easily.  All other systems reviewed and are negative.   BP 129/85 (BP Location: Right Arm, Patient Position: Sitting, Cuff Size: Normal)   Pulse 75   Resp 20   Ht 5\' 3"  (1.6 m)   Wt 190 lb (86.2 kg)   SpO2 98% Comment: RA  BMI 33.66 kg/m  Physical Exam Vitals reviewed.  Constitutional:      General: Alyssa Melendez is not in acute distress.    Appearance: Normal appearance.  HENT:     Head: Normocephalic and atraumatic.  Eyes:     General: No scleral icterus.    Extraocular Movements: Extraocular movements intact.  Cardiovascular:     Rate and Rhythm: Normal rate and regular rhythm.     Heart sounds: Normal heart sounds. No murmur heard.    No friction rub. No gallop.  Pulmonary:     Effort: Pulmonary effort is normal. No respiratory distress.     Breath sounds: Normal breath sounds. No wheezing or rales.  Abdominal:     General: There is no distension.     Palpations: Abdomen is soft.  Musculoskeletal:     Cervical back: Neck supple.   Lymphadenopathy:     Cervical:  No cervical adenopathy.  Skin:    General: Skin is warm and dry.  Neurological:     General: No focal deficit present.     Mental Status: Alyssa Melendez is alert and oriented to person, place, and time.     Cranial Nerves: No cranial nerve deficit.     Motor: No weakness.    Diagnostic Tests: CT CHEST WITH CONTRAST   TECHNIQUE: Multidetector CT imaging of the chest was performed during intravenous contrast administration.   RADIATION DOSE REDUCTION: This exam was performed according to the departmental dose-optimization program which includes automated exposure control, adjustment of the mA and/or kV according to patient size and/or use of iterative reconstruction technique.   CONTRAST:  75mL OMNIPAQUE IOHEXOL 300 MG/ML  SOLN   COMPARISON:  03/02/2023 chest radiograph.   FINDINGS: Cardiovascular: Normal heart size. No significant pericardial effusion/thickening. Mitral annuloplasty ring in place. 2 lead left subclavian ICD with lead tips in the right atrium and right ventricular apex. Great vessels are normal in course and caliber.   Mediastinum/Nodes: No significant thyroid nodules. Unremarkable esophagus. No pathologically enlarged axillary, mediastinal or hilar lymph nodes, noting limited sensitivity for the detection of hilar adenopathy on this noncontrast study.   Lungs/Pleura: No pneumothorax. No pleural effusion. Chronic geographic air trapping in the posteromedial basilar left lower lobe with associated mucoid impaction within a dilated central bronchiole (series 3/image 121), favoring pulmonary sequestration. No acute consolidative airspace disease, lung masses or significant pulmonary nodules. Scattered thin curvilinear parenchymal bands in the right middle lobe and anterior right lower lobe.   Upper abdomen: Small hiatal hernia.   Musculoskeletal: No aggressive appearing focal osseous lesions. Minimal thoracic spondylosis.    IMPRESSION: 1. Chronic geographic air trapping in the posteromedial basilar left lower lobe with associated mucoid impaction within a dilated central bronchiole, favoring pulmonary sequestration. 2. No acute pulmonary disease. 3. Small hiatal hernia.     Electronically Signed   By: Delbert Phenix M.D.   On: 04/24/2023 21:57   I personally reviewed the CT images.  Area of hyperlucency with air trapping and paucity of vessels and likely mucoid impaction in the central bronchus in the left lower lobe.  Unchanged in comparison to a CT from 2006 done for renal stones, with the exception of possible slightly more mucoid impaction.  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  Chronic cough-has had a cough for about 3 months now.  Has not responded to steroids or antibiotics.  I do not think there is any role for additional antibiotics in the setting as Alyssa Melendez is not having any fevers or chills or productive cough.  Workup revealed an area of air trapping with a relative paucity of vessels and possible mucoid impaction and a dilated bronchus in the left lower lobe.  Differential diagnosis includes an area of bronchial atresia versus an intrapulmonary sequestration.  There is no evidence of a systemic arterial blood supply on the CT with contrast.  I had a long discussion with Alyssa Melendez and her husband.  They understand the cough could be related to the sequestration but there is no way to definitively determine that prior to surgery.  As a correlate there is a possibility that surgery could eliminate the cough but there is no guarantee.  I described the proposed operation to  them.  The plan would be to do a robotic assisted left VATS for wedge resection of the area.  This is a  relatively small area in the base of the left lower lobe and there is no reason to do a lobectomy for it.  I informed them of the general nature of the procedure including the incisions to be used, the use of the surgical robot, the use of a drainage tube postoperatively, the expected hospital stay, and the overall recovery.  I informed him of the indications, risk, benefits, and alternatives.  They understand the risks include, but are not limited to death, MI, DVT, PE, bleeding, possible need for transfusion, possible need for conversion to open procedure, infection, air leak, rib fractures, cardiac arrhythmias or cardiac arrest, as well as possibility of other unforeseeable complications.  They understand the degree of pain is unpredictable.  Alyssa Melendez is not ready to proceed with surgery yet.  Alyssa Melendez wishes to think over her options and has an appointment with pulmonology coming up in the near future.  Plan: Return in 6 weeks to further discuss possible surgical resection  Loreli Slot, MD [facility] [phone]

## 2023-05-19 ENCOUNTER — Encounter: Admitting: Pulmonary Disease

## 2023-05-27 ENCOUNTER — Other Ambulatory Visit: Payer: Self-pay

## 2023-05-28 ENCOUNTER — Other Ambulatory Visit: Payer: Self-pay

## 2023-05-28 ENCOUNTER — Ambulatory Visit (INDEPENDENT_AMBULATORY_CARE_PROVIDER_SITE_OTHER): Admitting: Pulmonary Disease

## 2023-05-28 ENCOUNTER — Encounter: Payer: Self-pay | Admitting: Pulmonary Disease

## 2023-05-28 VITALS — BP 102/68 | HR 81 | Ht 63.0 in | Wt 190.4 lb

## 2023-05-28 DIAGNOSIS — R053 Chronic cough: Secondary | ICD-10-CM

## 2023-05-28 DIAGNOSIS — Q332 Sequestration of lung: Secondary | ICD-10-CM

## 2023-05-28 MED ORDER — LEVOFLOXACIN 750 MG PO TABS
750.0000 mg | ORAL_TABLET | Freq: Every day | ORAL | 0 refills | Status: DC
  Filled 2023-05-28: qty 7, 7d supply, fill #0

## 2023-05-28 MED ORDER — FLUTICASONE-SALMETEROL 500-50 MCG/ACT IN AEPB
1.0000 | INHALATION_SPRAY | Freq: Two times a day (BID) | RESPIRATORY_TRACT | 6 refills | Status: AC
  Filled 2023-05-28: qty 60, 30d supply, fill #0
  Filled 2023-06-28: qty 60, 30d supply, fill #1

## 2023-05-28 MED ORDER — PREDNISONE 20 MG PO TABS
ORAL_TABLET | ORAL | 0 refills | Status: AC
Start: 2023-05-28 — End: 2023-06-07
  Filled 2023-05-28: qty 15, 10d supply, fill #0

## 2023-05-28 NOTE — Progress Notes (Signed)
 @Patient  ID: Alyssa Melendez, female    DOB: [birth year], 53 y.o.   MRN: [id number]  Chief Complaint  Patient presents with   Follow-up    Pt state upper rest 11/24 still having issues since horse ness , started coughing up greenish sputum     Referring provider: Sherlene Shams, MD  HPI:   53 y.o. 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  Most recent cardiothoracic surgery note reviewed.  Multiple notes from primary care doctor reviewed.  Patient got ill November 2024.  Sound like viral illness typical upper respiratory infection symptoms.  A lot of cough congestion fatigue.  Really was feeling quite ill.  Was given a round of steroids and 2 courses of Augmentin.  She really does not think this helped with her initial symptoms.  Over time the severity of her symptoms of gradually improved.  Her cough is still persistent was dry but now a bit more productive over the last several weeks.  She has a nasally voice but denies any runny nose or significant nasal congestion at least nothing comes out her nose and she denies postnasal drip.  She does have a history of reflux heartburn but her symptoms seem well treated with PPI.  As part of the workup she had a chest x-ray was clear with rounded lucency with clear borders seen 02/2023 on the lateral view.  When compared to his recent x-ray 03/2021 similar lucency is seen.  Subsequent CT scan was obtained which demonstrated clear lungs with evidence of likely pulmonary sequestration with the left lower lobe posterior area with lucency, mucoid impaction, no clear bronchus terminating in that area.  It is quite clear the area lucency seen on these chest x-ray that preceded the CT scan are this area of presumed pulmonary sequestration.  She denies any history of recurrent pneumonias or bronchitis in the past.  No hypoxemia.  No significant dyspnea on exertion.  Questionaires /  Pulmonary Flowsheets:   ACT:      No data to display          MMRC:     No data to display          Epworth:      No data to display          Tests:   FENO:  No results found for: "NITRICOXIDE"  PFT:     No data to display          WALK:      No data to display          Imaging: Personally reviewed as per EMR and discussion of this note No results found.  Lab Results: Personally reviewed CBC    Component Value Date/Time   WBC 7.0 03/24/2023 1349   RBC 4.55 03/24/2023 1349   HGB 14.1 03/24/2023 1349   HGB 13.2 07/01/2017 0953   HCT 42.5 03/24/2023 1349   HCT 39.3 07/01/2017 0953   PLT 284.0 03/24/2023 1349   PLT 284 07/01/2017 0953   MCV 93.5 03/24/2023 1349   MCV 92 07/01/2017 0953   MCH 30.6 08/06/2020 2102   MCHC 33.3 03/24/2023 1349   RDW 13.5 03/24/2023 1349   RDW 14.0 07/01/2017 0953   LYMPHSABS 0.9 03/24/2023 1349   MONOABS 0.6 03/24/2023 1349   EOSABS 0.2 03/24/2023 1349   BASOSABS 0.1 03/24/2023 1349    BMET    Component Value Date/Time  NA 139 03/24/2023 1349   NA 139 07/01/2017 0953   K 3.9 03/24/2023 1349   CL 105 03/24/2023 1349   CO2 28 03/24/2023 1349   GLUCOSE 83 03/24/2023 1349   BUN 14 03/24/2023 1349   BUN 8 07/01/2017 0953   CREATININE 1.00 03/24/2023 1349   CALCIUM 8.8 03/24/2023 1349   GFRNONAA 59 (L) 12/25/2022 1656   GFRAA 91 07/01/2017 0953    BNP    Component Value Date/Time   BNP 95.8 12/25/2022 1654    ProBNP No results found for: "PROBNP"  Specialty Problems       Pulmonary Problems   Exertional dyspnea   Onset p cardiac arrest 2009 assoc with wt gain  - s/p MV repair 2012 - Stress Echo 10/11/20 nl       Pharyngitis   Chronic bronchitis (HCC)   Pulmonary sequestration    Allergies  Allergen Reactions   Morphine And Codeine     GI - vomitting    Immunization History  Administered Date(s) Administered   Hepatitis A 03/02/2013   Hepatitis B 03/02/2013   Influenza  Split 12/30/2011, 12/22/2012   Influenza-Unspecified 11/17/2013, 11/05/2015, 10/23/2016, 11/17/2017, 12/18/2018, 12/02/2019, 11/14/2020   PFIZER(Purple Top)SARS-COV-2 Vaccination 02/22/2019, 03/15/2019, 11/23/2019   Tdap 01/30/2018    Past Medical History:  Diagnosis Date   Barlow syndrome    Cardiac arrest (HCC) 11/22/2012   Complex ovarian cyst    Congestive heart failure (HCC)    Fibroadenoma    Hx-sudden cardiac arrest 2009   secondary to Long QT Syndrome   Increased BMI    LLQ pain    Long Q-T syndrome 2009   with Sudden Cardiac arrest   Mitral valve prolapse    OAB (overactive bladder)     Tobacco History: Social History   Tobacco Use  Smoking Status Never  Smokeless Tobacco Never   Counseling given: Not Answered   Continue to not smoke  Outpatient Encounter Medications as of 05/28/2023  Medication Sig   aspirin 81 MG tablet Take 81 mg by mouth daily.   atenolol (TENORMIN) 25 MG tablet Take 1 tablet (25 mg total) by mouth daily.   Cholecalciferol 2000 UNITS CAPS Take 1 capsule by mouth daily.   Coenzyme Q10 (COQ10) 100 MG CAPS Take 100 mg by mouth daily.   fluticasone-salmeterol (WIXELA INHUB) 500-50 MCG/ACT AEPB Inhale 1 puff into the lungs in the morning and at bedtime.   levofloxacin (LEVAQUIN) 750 MG tablet Take 1 tablet (750 mg total) by mouth daily.   levonorgestrel (MIRENA) 20 MCG/DAY IUD 1 each by Intrauterine route once. INSERTED 05/06/21   omeprazole (PRILOSEC) 20 MG capsule Take 1 capsule (20 mg total) by mouth daily.   predniSONE (DELTASONE) 20 MG tablet Take 2 tablets (40 mg total) by mouth daily with breakfast for 5 days, THEN 1 tablet (20 mg total) daily with breakfast for 5 days.   rosuvastatin (CRESTOR) 5 MG tablet Take 1 tablet (5 mg total) by mouth daily.   [DISCONTINUED] amoxicillin (AMOXIL) 500 MG capsule Take 2,000 mg by mouth. PRN BEFORE DENTAL PROCEDURES   [DISCONTINUED] fluticasone-salmeterol (ADVAIR) 100-50 MCG/ACT AEPB Inhale 1 puff into  the lungs 2 (two) times daily.   famotidine (PEPCID) 20 MG tablet Take 20 mg by mouth 2 (two) times daily. (Patient not taking: Reported on 05/28/2023)   No facility-administered encounter medications on file as of 05/28/2023.     Review of Systems  Review of Systems  No chest pain with exertion.  No orthopnea  or PND.  Comprehensive review of systems otherwise negative. Physical Exam  BP 102/68 (BP Location: Left Arm, Patient Position: Sitting, Cuff Size: Normal)   Pulse 81   Ht 5\' 3"  (1.6 m)   Wt 190 lb 6.4 oz (86.4 kg)   SpO2 99%   BMI 33.73 kg/m   Wt Readings from Last 5 Encounters:  05/28/23 190 lb 6.4 oz (86.4 kg)  05/12/23 190 lb (86.2 kg)  04/30/23 189 lb 9.6 oz (86 kg)  03/24/23 188 lb 12.8 oz (85.6 kg)  03/02/23 190 lb (86.2 kg)    BMI Readings from Last 5 Encounters:  05/28/23 33.73 kg/m  05/12/23 33.66 kg/m  04/30/23 33.59 kg/m  03/24/23 33.44 kg/m  03/02/23 33.66 kg/m     Physical Exam General: Sitting in chair, no acute distress Eyes: EOMI, no icterus Neck: Supple, no JVP Pulmonary: Clear, no work of breathing Cardiovascular: Warm, no edema, regular rate and rhythm Abdomen: Nondistended MSK: No synovitis, no joint effusion Neuro: Normal gait, no weakness Psych: Normal mood, full affect   Assessment & Plan:   Chronic cough: Present since November 2024 treated with sounds like viral illness.  No preceding issues.  Initially treated with antibiotics steroids without much improvement.  But she that she was feeling quite ill with the initial symptoms.  Given history about what sounds like likely viral illness high suspicion for development of chronic cough related to asthma, cough variant asthma.  Reflux considered she does have a hiatal hernia however does not her cough was not an issue prior to her illness.  Maybe the hiatal hernia has developed in the interim and that is related issue with silent reflux.  Repeat prednisone taper today.  Another round  of antibiotics, levofloxacin, unlikely to be very helpful but willing to be aggressive.  Increase Wixela from low-dose to high-dose discus.  Nasally voice, hoarseness: Likely multifactorial but seems of started with illness.  Addressing as above.  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  Mucus impaction.  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  This seems less likely given she was asymptomatic for 50 years.  But it is possible.  Efforts as above to improve symptoms.  She has upcoming second opinion from [facility] regarding resection.  Our goal is to try to improve her symptoms.  But if the surgeon feels that surgical resection is needed regardless I trust her opinion.  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   Return in about 7 weeks (around 07/16/2023).   Karren Burly, MD 05/28/2023   This appointment required 42 minutes of patient care (this includes precharting, chart review, review of results, face-to-face care, etc.).

## 2023-05-28 NOTE — Patient Instructions (Signed)
 Start high dose wixela 1 puff twice a day every day.  Rinse your mouth out with water after every use.  I am helpful to increase in steroid dose may help with the cough.  Take prednisone 40 mg for 5 days then 10 mg for 5 days then stop  Take levofloxacin 1 tablet daily for 7 days then stop  Fluconazole we have been aggressive in trying to treat the nasal underlying inflammation in the air tubes similar to asthma.  This often can be triggered after infections, viral infection.  I am curious or suspicious this is what occurred after your November illness.  It is possible that pulmonary sequestration is also contributing, that there is local inflammation in that area possibly related to your recent illness that just is not getting better given its abnormal blood flow etc.  Not sure whether you have ventilator of life certainly possible.  I think we should continue to try to treat something like asthma as above and sleeping to make things better before committing to surgery.  Of course I want you to discuss with the [facility] surgeons and get their opinion as well.  Return to clinic in 6 weeks or sooner as needed after [facility] appointment

## 2023-06-23 ENCOUNTER — Ambulatory Visit: Admitting: Thoracic Surgery (Cardiothoracic Vascular Surgery)

## 2023-06-23 ENCOUNTER — Encounter: Payer: Self-pay | Admitting: Thoracic Surgery (Cardiothoracic Vascular Surgery)

## 2023-06-28 ENCOUNTER — Other Ambulatory Visit: Payer: Self-pay

## 2023-06-29 ENCOUNTER — Other Ambulatory Visit: Payer: Self-pay

## 2023-06-29 MED ORDER — ATENOLOL 25 MG PO TABS
25.0000 mg | ORAL_TABLET | Freq: Every day | ORAL | 3 refills | Status: AC
  Filled 2023-06-29: qty 90, 90d supply, fill #0
  Filled 2023-09-22: qty 90, 90d supply, fill #1
  Filled 2023-11-19 – 2024-01-02 (×2): qty 90, 90d supply, fill #2
  Filled 2024-03-14: qty 90, 90d supply, fill #3

## 2023-07-02 ENCOUNTER — Telehealth: Payer: Self-pay

## 2023-07-02 ENCOUNTER — Encounter: Payer: Self-pay | Admitting: Pulmonary Disease

## 2023-07-02 ENCOUNTER — Other Ambulatory Visit: Payer: Self-pay

## 2023-07-02 ENCOUNTER — Ambulatory Visit (INDEPENDENT_AMBULATORY_CARE_PROVIDER_SITE_OTHER): Admitting: Pulmonary Disease

## 2023-07-02 VITALS — BP 119/80 | HR 71 | Ht 63.0 in | Wt 190.2 lb

## 2023-07-02 DIAGNOSIS — J029 Acute pharyngitis, unspecified: Secondary | ICD-10-CM

## 2023-07-02 DIAGNOSIS — R0600 Dyspnea, unspecified: Secondary | ICD-10-CM

## 2023-07-02 DIAGNOSIS — Q332 Sequestration of lung: Secondary | ICD-10-CM | POA: Diagnosis not present

## 2023-07-02 DIAGNOSIS — J42 Unspecified chronic bronchitis: Secondary | ICD-10-CM

## 2023-07-02 DIAGNOSIS — R053 Chronic cough: Secondary | ICD-10-CM

## 2023-07-02 MED ORDER — ALBUTEROL SULFATE HFA 108 (90 BASE) MCG/ACT IN AERS
2.0000 | INHALATION_SPRAY | RESPIRATORY_TRACT | 6 refills | Status: AC | PRN
Start: 2023-07-02 — End: ?
  Filled 2023-07-02: qty 6.7, 17d supply, fill #0
  Filled 2023-08-06: qty 6.7, 17d supply, fill #1

## 2023-07-02 MED ORDER — BREZTRI AEROSPHERE 160-9-4.8 MCG/ACT IN AERO: 2.0000 | INHALATION_SPRAY | Freq: Two times a day (BID) | RESPIRATORY_TRACT | Status: AC

## 2023-07-02 NOTE — Patient Instructions (Signed)
 I am sorry that things are not better despite the inhalers   Lets try Breztri 2 puffs twice a day every day, rinse her mouth after every use.  This will replace the Advair .  Use albuterol 2 puffs every 4 hours as needed for cough or shortness of breath.  Or wheezing.  See me a message after the visit with the surgeon, we can decide follow-up from there.  If there are no plans for surgery then lets plan to follow-up within a few weeks and discuss continuing down the pathway of asthma treatment.  If there are plans for surgery then let me know and we can back off on follow-up until you have recovered.

## 2023-07-02 NOTE — Telephone Encounter (Signed)
 Copied from CRM [id number]. Topic: Appointments - Scheduling Inquiry for Clinic >> Jul 02, 2023 12:11 PM Alyssa Melendez wrote: Reason for CRM:  Patient is requesting a phone call from [place] regarding the MyChart message the patient received from her this morning. Patient states she responded to [facility] message as to why she needs the appointment with Dr. Marygrace Snellen today 5/15 at 2 PM  but would like confirmation that its okay for her to come as she is driving from [place].\     Patient was seen nfn

## 2023-07-02 NOTE — Telephone Encounter (Signed)
 LMTCB, was calling in reference to being seen by dr,hunsucker today at 2

## 2023-07-02 NOTE — Progress Notes (Signed)
@Patient  ID: Alyssa Melendez, female    DOB: 12/31/1970, 53 y.o.   MRN: [id number]  Chief Complaint  Patient presents with   Follow-up    Transfer of care    Referring provider: Thersia Flax, MD  HPI:   53 y.o. 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    At last visit we discussed possible developed asthma after treatment symptoms after illness, sounds like viral illness November 2024.  No improvement with high-dose Advair .  Wheezing over the last 2 weeks.  Comes and goes.  Not reliably reproducible.  Cough essentially is gone at night.  Sleeps well.  But present throughout the day.  Prickly bad in the morning.  Feels wet but not productive.  She has appointment with the cardiothoracic surgeon next week at [facility] for opinion on pulmonary sequestration.   HPI at initial visit:  Patient got ill November 2024.  Sound like viral illness typical upper respiratory infection symptoms.  A lot of cough congestion fatigue.  Really was feeling quite ill.  Was given a round of steroids and 2 courses of Augmentin .  She really does not think this helped with her initial symptoms.  Over time the severity of her symptoms of gradually improved.  Her cough is still persistent was dry but now a bit more productive over the last several weeks.  She has a nasally voice but denies any runny nose or significant nasal congestion at least nothing comes out her nose and she denies postnasal drip.  She does have a history of reflux heartburn but her symptoms seem well treated with PPI.  As part of the workup she had a chest x-ray was clear with rounded lucency with clear borders seen 02/2023 on the lateral view.  When compared to his recent x-ray 03/2021 similar lucency is seen.  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  It is quite clear the area lucency seen on these chest x-ray that preceded the CT scan are this area of presumed pulmonary sequestration.  She denies any history of recurrent pneumonias or bronchitis in the past.  No hypoxemia.  No significant dyspnea on exertion.  Questionaires / Pulmonary Flowsheets:   ACT:      No data to display          MMRC:     No data to display          Epworth:      No data to display          Tests:   FENO:  No results found for: "NITRICOXIDE"  PFT:     No data to display          WALK:      No data to display          Imaging: Personally reviewed as per EMR and discussion of this note No results found.  Lab Results: Personally reviewed CBC    Component Value Date/Time   WBC 7.0 03/24/2023 1349   RBC 4.55 03/24/2023 1349   HGB 14.1 03/24/2023 1349   HGB 13.2 07/01/2017 0953   HCT 42.5 03/24/2023 1349   HCT 39.3 07/01/2017 0953   PLT 284.0 03/24/2023 1349   PLT 284 07/01/2017 0953   MCV 93.5 03/24/2023 1349  MCV 92 07/01/2017 0953   MCH 30.6 08/06/2020 2102   MCHC 33.3 03/24/2023 1349   RDW 13.5 03/24/2023 1349   RDW 14.0 07/01/2017 0953   LYMPHSABS 0.9 03/24/2023 1349   MONOABS 0.6 03/24/2023 1349   EOSABS 0.2 03/24/2023 1349   BASOSABS 0.1 03/24/2023 1349    BMET    Component Value Date/Time   NA 139 03/24/2023 1349   NA 139 07/01/2017 0953   K 3.9 03/24/2023 1349   CL 105 03/24/2023 1349   CO2 28 03/24/2023 1349   GLUCOSE 83 03/24/2023 1349   BUN 14 03/24/2023 1349   BUN 8 07/01/2017 0953   CREATININE 1.00 03/24/2023 1349   CALCIUM  8.8 03/24/2023 1349   GFRNONAA 59 (L) 12/25/2022 1656   GFRAA 91 07/01/2017 0953    BNP    Component Value Date/Time   BNP 95.8 12/25/2022 1654    ProBNP No results found for: "PROBNP"  Specialty Problems       Pulmonary Problems   Exertional dyspnea   Onset p cardiac arrest 2009  assoc with wt gain  - s/p MV repair 2012 - Stress Echo 10/11/20 nl       Pharyngitis   Chronic bronchitis (HCC)   Pulmonary sequestration    Allergies  Allergen Reactions   Morphine And Codeine     GI - vomitting    Immunization History  Administered Date(s) Administered   Hepatitis A 03/02/2013   Hepatitis B 03/02/2013   Influenza Split 12/30/2011, 12/22/2012   Influenza-Unspecified 11/17/2013, 11/05/2015, 10/23/2016, 11/17/2017, 12/18/2018, 12/02/2019, 11/14/2020   PFIZER(Purple Top)SARS-COV-2 Vaccination 02/22/2019, 03/15/2019, 11/23/2019   Tdap 01/30/2018    Past Medical History:  Diagnosis Date   Barlow syndrome    Cardiac arrest (HCC) 11/22/2012   Complex ovarian cyst    Congestive heart failure (HCC)    Fibroadenoma    Hx-sudden cardiac arrest 2009   secondary to Long QT Syndrome   Increased BMI    LLQ pain    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   aspirin 81 MG tablet Take 81 mg by mouth daily.   atenolol  (TENORMIN ) 25 MG tablet Take 1 tablet (25 mg total) by mouth daily.   budesonide-glycopyrrolate-formoterol (BREZTRI AEROSPHERE) 160-9-4.8 MCG/ACT AERO inhaler Inhale 2 puffs into the lungs in the morning and at bedtime.   Cholecalciferol 2000 UNITS CAPS Take 1 capsule by mouth daily.   Coenzyme Q10 (COQ10) 100 MG CAPS Take 100 mg by mouth daily.   fluticasone -salmeterol (WIXELA INHUB ) 500-50 MCG/ACT AEPB Inhale 1 puff into the lungs in the morning and at bedtime.   levonorgestrel   (MIRENA ) 20 MCG/DAY IUD 1 each by Intrauterine route once. INSERTED 05/06/21   omeprazole  (PRILOSEC) 20 MG capsule Take 1 capsule (20 mg total) by mouth daily.   rosuvastatin  (CRESTOR ) 5 MG tablet Take 1 tablet (5 mg total) by mouth daily.   [DISCONTINUED] famotidine  (PEPCID ) 20 MG tablet Take 20 mg by mouth 2 (two) times daily. (Patient not  taking: Reported on 07/02/2023)   [DISCONTINUED] levofloxacin  (LEVAQUIN ) 750 MG tablet Take 1 tablet (750 mg total) by mouth daily. (Patient not taking: Reported on 07/02/2023)   No facility-administered encounter medications on file as of 07/02/2023.     Review of Systems  Review of Systems  No chest pain with exertion.  No orthopnea or PND.  Comprehensive review of systems otherwise negative. Physical Exam  BP 119/80 (BP Location: Left Arm, Patient Position: Sitting, Cuff Size: Large)   Pulse 71   Ht 5\' 3"  (1.6 m)   Wt 190 lb 3.2 oz (86.3 kg)   SpO2 98%   BMI 33.69 kg/m   Wt Readings from Last 5 Encounters:  07/02/23 190 lb 3.2 oz (86.3 kg)  05/28/23 190 lb 6.4 oz (86.4 kg)  05/12/23 190 lb (86.2 kg)  04/30/23 189 lb 9.6 oz (86 kg)  03/24/23 188 lb 12.8 oz (85.6 kg)    BMI Readings from Last 5 Encounters:  07/02/23 33.69 kg/m  05/28/23 33.73 kg/m  05/12/23 33.66 kg/m  04/30/23 33.59 kg/m  03/24/23 33.44 kg/m     Physical Exam General: Sitting in chair, no acute distress Eyes: EOMI, no icterus Neck: Supple, no JVP Pulmonary: Clear, no work of breathing Cardiovascular: Warm, no edema, regular rate and rhythm Abdomen: Nondistended MSK: No synovitis, no joint effusion Neuro: Normal gait, no weakness Psych: Normal mood, full affect   Assessment & Plan:   Chronic cough: Present since November 2024 treated with sounds like viral illness.  No preceding issues.  Initially treated with antibiotics steroids without much improvement.  But she that she was feeling quite ill with the initial symptoms.  Given history about what sounds  like likely viral illness high suspicion for development of chronic cough related to asthma, cough variant asthma.  Reflux considered she does have a hiatal hernia however does not her cough was not an issue prior to her illness.  Maybe the hiatal hernia has developed in the interim and that is related issue with silent reflux.  Repeated courses of antibiotics and steroids without improvement.  Escalated to high-dose Advair  at last visit without improvement.  Escalate to triple inhaled therapy via Breztri to see if HFA is better than DPI administration.  Worried that area of impacted mucus, possibly superinfected area of sequestration is causing ongoing inflammatory cascade not being well treated with repeated therapies.  Has upcoming opinion, evaluation from surgeon at [facility].  Can continue to escalate asthma therapies to injectables.  Ultimately if does not help we could explore neurogenic cough, ENT evaluation at [facility].  Wheezing: Really new over the last couple of weeks.  Improved with albuterol.  Albuterol prescription today.  Escalate Breztri as above.  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  Mucus impaction.  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  This seems less likely given she was asymptomatic for 50 years.  But it is possible.  Efforts as above to improve symptoms.  She has upcoming second opinion from [facility] regarding resection.  Our goal is to try to improve her symptoms.  But if the surgeon feels that surgical resection is needed regardless I trust their opinion.  Obviously, would like to avoid surgery given it really has not caused her problems assuming this is congenital until recently.   No follow-ups on file.   Guerry Leek, MD  07/02/2023   

## 2023-07-07 DIAGNOSIS — Q332 Sequestration of lung: Secondary | ICD-10-CM | POA: Diagnosis not present

## 2023-07-07 DIAGNOSIS — Z789 Other specified health status: Secondary | ICD-10-CM | POA: Diagnosis not present

## 2023-07-07 DIAGNOSIS — R942 Abnormal results of pulmonary function studies: Secondary | ICD-10-CM | POA: Diagnosis not present

## 2023-07-07 DIAGNOSIS — Z01818 Encounter for other preprocedural examination: Secondary | ICD-10-CM | POA: Diagnosis not present

## 2023-07-07 DIAGNOSIS — R053 Chronic cough: Secondary | ICD-10-CM | POA: Diagnosis not present

## 2023-07-07 DIAGNOSIS — I469 Cardiac arrest, cause unspecified: Secondary | ICD-10-CM | POA: Diagnosis not present

## 2023-07-15 ENCOUNTER — Encounter: Payer: Self-pay | Admitting: Internal Medicine

## 2023-07-17 NOTE — Telephone Encounter (Signed)
 I have scheduled pt with you on 08/05/2023 to discuss. Pt is aware and okay with the appt.

## 2023-07-29 ENCOUNTER — Other Ambulatory Visit: Payer: Self-pay | Admitting: Internal Medicine

## 2023-07-29 ENCOUNTER — Other Ambulatory Visit: Payer: Self-pay

## 2023-07-29 DIAGNOSIS — I4581 Long QT syndrome: Secondary | ICD-10-CM | POA: Diagnosis not present

## 2023-07-29 DIAGNOSIS — K219 Gastro-esophageal reflux disease without esophagitis: Secondary | ICD-10-CM

## 2023-07-29 DIAGNOSIS — I469 Cardiac arrest, cause unspecified: Secondary | ICD-10-CM | POA: Diagnosis not present

## 2023-07-29 DIAGNOSIS — R053 Chronic cough: Secondary | ICD-10-CM

## 2023-07-29 DIAGNOSIS — Z4502 Encounter for adjustment and management of automatic implantable cardiac defibrillator: Secondary | ICD-10-CM | POA: Diagnosis not present

## 2023-07-29 MED ORDER — OMEPRAZOLE 40 MG PO CPDR
40.0000 mg | DELAYED_RELEASE_CAPSULE | Freq: Every day | ORAL | 1 refills | Status: AC
  Filled 2023-07-29: qty 90, 90d supply, fill #0
  Filled 2023-11-19: qty 90, 90d supply, fill #1

## 2023-07-30 ENCOUNTER — Other Ambulatory Visit: Payer: Self-pay

## 2023-08-05 ENCOUNTER — Ambulatory Visit: Admitting: Internal Medicine

## 2023-08-05 ENCOUNTER — Encounter: Payer: Self-pay | Admitting: Internal Medicine

## 2023-08-05 ENCOUNTER — Other Ambulatory Visit: Payer: Self-pay

## 2023-08-05 VITALS — BP 108/72 | HR 80 | Ht 63.0 in | Wt 187.6 lb

## 2023-08-05 DIAGNOSIS — R053 Chronic cough: Secondary | ICD-10-CM

## 2023-08-05 MED ORDER — BENZONATATE 200 MG PO CAPS
200.0000 mg | ORAL_CAPSULE | Freq: Three times a day (TID) | ORAL | 0 refills | Status: AC | PRN
  Filled 2023-08-05: qty 90, 30d supply, fill #0

## 2023-08-05 NOTE — Patient Instructions (Addendum)
 I am prescribing tessalon  at the 200 mg dose three times daily as a trial   Ask Bensimhon about the GI referral for an EGD to rule out eosinophilic esophagitis

## 2023-08-05 NOTE — Progress Notes (Unsigned)
 Subjective:  Patient ID: Alyssa Melendez, female    DOB: 02-22-1970  Age: 53 y.o. MRN: [id number]  CC: There were no encounter diagnoses.   HPI Cristal Qadir presents for  Chief Complaint  Patient presents with   Medical Management of Chronic Issues    Follow up on chronic cough      Outpatient Medications Prior to Visit  Medication Sig Dispense Refill   albuterol  (VENTOLIN  HFA) 108 (90 Base) MCG/ACT inhaler Inhale 2 puffs into the lungs every 4 (four) hours as needed for wheezing or shortness of breath. 8 g 6   aspirin 81 MG tablet Take 81 mg by mouth daily.     atenolol  (TENORMIN ) 25 MG tablet Take 1 tablet (25 mg total) by mouth daily. 90 tablet 3   budesonide-glycopyrrolate-formoterol (BREZTRI  AEROSPHERE) 160-9-4.8 MCG/ACT AERO inhaler Inhale 2 puffs into the lungs in the morning and at bedtime.     Cholecalciferol 2000 UNITS CAPS Take 1 capsule by mouth daily.     Coenzyme Q10 (COQ10) 100 MG CAPS Take 100 mg by mouth daily. 30 capsule 5   levonorgestrel  (MIRENA ) 20 MCG/DAY IUD 1 each by Intrauterine route once. INSERTED 05/06/21     omeprazole  (PRILOSEC) 40 MG capsule Take 1 capsule (40 mg total) by mouth daily. 90 capsule 1   rosuvastatin  (CRESTOR ) 5 MG tablet Take 1 tablet (5 mg total) by mouth daily. 90 tablet 3   fluticasone -salmeterol (WIXELA INHUB ) 500-50 MCG/ACT AEPB Inhale 1 puff into the lungs in the morning and at bedtime. (Patient not taking: Reported on 08/05/2023) 60 each 6   No facility-administered medications prior to visit.    Review of Systems;  Patient denies headache, fevers, malaise, unintentional weight loss, skin rash, eye pain, sinus congestion and sinus pain, sore throat, dysphagia,  hemoptysis , cough, dyspnea, wheezing, chest pain, palpitations, orthopnea, edema, abdominal pain, nausea, melena, diarrhea, constipation, flank pain, dysuria, hematuria, urinary  Frequency, nocturia, numbness, tingling, seizures,  Focal weakness, Loss of consciousness,   Tremor, insomnia, depression, anxiety, and suicidal ideation.      Objective:  BP 108/72   Pulse 80   Ht 5' 3 (1.6 m)   Wt 187 lb 9.6 oz (85.1 kg)   SpO2 96%   BMI 33.23 kg/m   BP Readings from Last 3 Encounters:  08/05/23 108/72  07/02/23 119/80  05/28/23 102/68    Wt Readings from Last 3 Encounters:  08/05/23 187 lb 9.6 oz (85.1 kg)  07/02/23 190 lb 3.2 oz (86.3 kg)  05/28/23 190 lb 6.4 oz (86.4 kg)    Physical Exam  Lab Results  Component Value Date   HGBA1C 6.0 03/24/2023   HGBA1C 5.8 03/19/2022   HGBA1C 5.6 03/13/2021    Lab Results  Component Value Date   CREATININE 1.00 03/24/2023   CREATININE 1.14 (H) 12/25/2022   CREATININE 0.95 03/19/2022    Lab Results  Component Value Date   WBC 7.0 03/24/2023   HGB 14.1 03/24/2023   HCT 42.5 03/24/2023   PLT 284.0 03/24/2023   GLUCOSE 83 03/24/2023   CHOL 159 03/24/2023   TRIG 138.0 03/24/2023   HDL 38.30 (L) 03/24/2023   LDLDIRECT 113.0 03/24/2023   LDLCALC 93 03/24/2023   ALT 17 03/24/2023   AST 19 03/24/2023   NA 139 03/24/2023   K 3.9 03/24/2023   CL 105 03/24/2023   CREATININE 1.00 03/24/2023   BUN 14 03/24/2023   CO2 28 03/24/2023   TSH 1.14 03/24/2023   INR 1.0 07/01/2017  HGBA1C 6.0 03/24/2023    CT Chest W Contrast Result Date: 04/24/2023 CLINICAL DATA:  Nonproductive cough for 3 months refractory to antibiotic therapy. Never smoker. EXAM: CT CHEST WITH CONTRAST TECHNIQUE: Multidetector CT imaging of the chest was performed during intravenous contrast administration. RADIATION DOSE REDUCTION: This exam was performed according to the departmental dose-optimization program which includes automated exposure control, adjustment of the mA and/or kV according to patient size and/or use of iterative reconstruction technique. CONTRAST:  75mL OMNIPAQUE  IOHEXOL  300 MG/ML  SOLN COMPARISON:  03/02/2023 chest radiograph. FINDINGS: Cardiovascular: Normal heart size. No significant pericardial  effusion/thickening. Mitral annuloplasty ring in place. 2 lead left subclavian ICD with lead tips in the right atrium and right ventricular apex. Great vessels are normal in course and caliber. Mediastinum/Nodes: No significant thyroid  nodules. Unremarkable esophagus. No pathologically enlarged axillary, mediastinal or hilar lymph nodes, noting limited sensitivity for the detection of hilar adenopathy on this noncontrast study. Lungs/Pleura: No pneumothorax. No pleural effusion. Chronic geographic air trapping in the posteromedial basilar left lower lobe with associated mucoid impaction within a dilated central bronchiole (series 3/image 121), favoring pulmonary sequestration. No acute consolidative airspace disease, lung masses or significant pulmonary nodules. Scattered thin curvilinear parenchymal bands in the right middle lobe and anterior right lower lobe. Upper abdomen: Small hiatal hernia. Musculoskeletal: No aggressive appearing focal osseous lesions. Minimal thoracic spondylosis. IMPRESSION: 1. Chronic geographic air trapping in the posteromedial basilar left lower lobe with associated mucoid impaction within a dilated central bronchiole, favoring pulmonary sequestration. 2. No acute pulmonary disease. 3. Small hiatal hernia. Electronically Signed   By: Levell Reach M.D.   On: 04/24/2023 21:57    Assessment & Plan:  .There are no diagnoses linked to this encounter.   I spent 34 minutes on the day of this face to face encounter reviewing patient's  most recent visit with cardiology,  nephrology,  and neurology,  prior relevant surgical and non surgical procedures, recent  labs and imaging studies, counseling on weight management,  reviewing the assessment and plan with patient, and post visit ordering and reviewing of  diagnostics and therapeutics with patient  .   Follow-up: No follow-ups on file.   Thersia Flax, MD

## 2023-08-06 ENCOUNTER — Other Ambulatory Visit: Payer: Self-pay

## 2023-08-06 DIAGNOSIS — R053 Chronic cough: Secondary | ICD-10-CM | POA: Insufficient documentation

## 2023-08-06 NOTE — Assessment & Plan Note (Signed)
 Awaiting ENT evaluation of larynx ,  and GI evaluation to rule out eosinophilic esophagitis .  Continue PPI , add cough suppressant

## 2023-08-26 DIAGNOSIS — K219 Gastro-esophageal reflux disease without esophagitis: Secondary | ICD-10-CM | POA: Diagnosis not present

## 2023-08-26 DIAGNOSIS — R053 Chronic cough: Secondary | ICD-10-CM | POA: Diagnosis not present

## 2023-08-27 ENCOUNTER — Ambulatory Visit
Admission: RE | Admit: 2023-08-27 | Discharge: 2023-08-27 | Disposition: A | Discharge status: Home / Self Care | Source: Ambulatory Visit | Attending: Internal Medicine | Admitting: Internal Medicine

## 2023-08-27 DIAGNOSIS — Z1231 Encounter for screening mammogram for malignant neoplasm of breast: Secondary | ICD-10-CM | POA: Diagnosis not present

## 2023-09-08 ENCOUNTER — Ambulatory Visit: Admitting: Internal Medicine

## 2023-09-22 ENCOUNTER — Other Ambulatory Visit: Payer: Self-pay | Admitting: Internal Medicine

## 2023-09-22 ENCOUNTER — Other Ambulatory Visit: Payer: Self-pay

## 2023-09-22 MED FILL — Coenzyme Q10 Cap 100 MG: ORAL | 30 days supply | Qty: 30 | Fill #0 | Status: AC

## 2023-09-28 ENCOUNTER — Other Ambulatory Visit: Payer: Self-pay

## 2023-09-28 DIAGNOSIS — R053 Chronic cough: Secondary | ICD-10-CM | POA: Diagnosis not present

## 2023-09-28 MED ORDER — OMEPRAZOLE 20 MG PO CPDR
20.0000 mg | DELAYED_RELEASE_CAPSULE | Freq: Two times a day (BID) | ORAL | 3 refills | Status: AC
  Filled 2023-09-28: qty 180, 90d supply, fill #0
  Filled 2024-01-02: qty 180, 90d supply, fill #1

## 2023-10-15 ENCOUNTER — Other Ambulatory Visit: Payer: Self-pay

## 2023-10-15 MED ORDER — AMOXICILLIN 500 MG PO CAPS
1000.0000 mg | ORAL_CAPSULE | ORAL | 0 refills | Status: AC
  Filled 2023-10-15: qty 4, 1d supply, fill #0

## 2023-11-04 DIAGNOSIS — I469 Cardiac arrest, cause unspecified: Secondary | ICD-10-CM | POA: Diagnosis not present

## 2023-11-04 DIAGNOSIS — Z4502 Encounter for adjustment and management of automatic implantable cardiac defibrillator: Secondary | ICD-10-CM | POA: Diagnosis not present

## 2023-11-19 MED FILL — Coenzyme Q10 Cap 100 MG: ORAL | 30 days supply | Qty: 30 | Fill #1 | Status: AC

## 2023-11-20 ENCOUNTER — Other Ambulatory Visit: Payer: Self-pay

## 2023-12-15 ENCOUNTER — Other Ambulatory Visit: Payer: Self-pay

## 2023-12-15 MED FILL — Coenzyme Q10 Cap 100 MG: ORAL | 30 days supply | Qty: 30 | Fill #2 | Status: AC

## 2023-12-21 ENCOUNTER — Other Ambulatory Visit: Payer: Self-pay | Admitting: *Deleted

## 2023-12-21 DIAGNOSIS — E782 Mixed hyperlipidemia: Secondary | ICD-10-CM

## 2023-12-21 NOTE — Progress Notes (Signed)
 Per Dr Cherrie order for Ca Score CT placed

## 2024-01-01 ENCOUNTER — Ambulatory Visit
Admission: RE | Admit: 2024-01-01 | Discharge: 2024-01-01 | Disposition: A | Discharge status: Home / Self Care | Payer: Self-pay | Source: Ambulatory Visit | Attending: Internal Medicine | Admitting: Internal Medicine

## 2024-01-01 DIAGNOSIS — E782 Mixed hyperlipidemia: Secondary | ICD-10-CM | POA: Insufficient documentation

## 2024-01-02 ENCOUNTER — Other Ambulatory Visit: Payer: Self-pay

## 2024-01-02 ENCOUNTER — Other Ambulatory Visit: Payer: Self-pay | Admitting: Internal Medicine

## 2024-01-03 ENCOUNTER — Ambulatory Visit (HOSPITAL_COMMUNITY): Payer: Self-pay | Admitting: Internal Medicine

## 2024-01-03 ENCOUNTER — Other Ambulatory Visit: Payer: Self-pay | Admitting: Internal Medicine

## 2024-01-03 ENCOUNTER — Other Ambulatory Visit: Payer: Self-pay

## 2024-01-04 ENCOUNTER — Other Ambulatory Visit: Payer: Self-pay

## 2024-01-04 MED FILL — Rosuvastatin Calcium Tab 5 MG: ORAL | 90 days supply | Qty: 90 | Fill #0 | Status: CN

## 2024-01-31 ENCOUNTER — Other Ambulatory Visit: Payer: Self-pay | Admitting: Internal Medicine

## 2024-01-31 MED FILL — Rosuvastatin Calcium Tab 5 MG: ORAL | 90 days supply | Qty: 90 | Fill #0 | Status: AC

## 2024-02-01 ENCOUNTER — Other Ambulatory Visit: Payer: Self-pay

## 2024-02-01 ENCOUNTER — Other Ambulatory Visit (HOSPITAL_COMMUNITY): Payer: Self-pay

## 2024-02-02 ENCOUNTER — Other Ambulatory Visit (HOSPITAL_COMMUNITY): Payer: Self-pay

## 2024-02-02 ENCOUNTER — Other Ambulatory Visit: Payer: Self-pay

## 2024-02-02 MED ORDER — COQ10 100 MG PO CAPS
100.0000 mg | ORAL_CAPSULE | Freq: Every day | ORAL | 2 refills | Status: AC
  Filled 2024-02-02: qty 30, 30d supply, fill #0
  Filled 2024-03-14: qty 30, 30d supply, fill #1

## 2024-03-15 ENCOUNTER — Other Ambulatory Visit: Payer: Self-pay

## 2024-03-16 ENCOUNTER — Other Ambulatory Visit: Payer: Self-pay
